# Patient Record
Sex: Female | Born: 1965 | Race: White | Hispanic: No | Marital: Married | Smoking: Never smoker
Health system: Southern US, Community
[De-identification: ages and names within clinical notes are randomized; demographics above are authoritative.]

## PROBLEM LIST (undated history)

## (undated) ENCOUNTER — Emergency Department: Payer: Self-pay

## (undated) DIAGNOSIS — F32A Depression, unspecified: Secondary | ICD-10-CM

## (undated) DIAGNOSIS — F329 Major depressive disorder, single episode, unspecified: Secondary | ICD-10-CM

## (undated) DIAGNOSIS — J45909 Unspecified asthma, uncomplicated: Secondary | ICD-10-CM

## (undated) DIAGNOSIS — F419 Anxiety disorder, unspecified: Secondary | ICD-10-CM

## (undated) HISTORY — PX: ABDOMINAL HYSTERECTOMY: SHX81

## (undated) HISTORY — DX: Unspecified asthma, uncomplicated: J45.909

## (undated) HISTORY — DX: Depression, unspecified: F32.A

## (undated) HISTORY — DX: Major depressive disorder, single episode, unspecified: F32.9

## (undated) HISTORY — DX: Anxiety disorder, unspecified: F41.9

---

## 2002-04-23 HISTORY — PX: TUBAL LIGATION: SHX77

## 2004-10-21 HISTORY — PX: OTHER SURGICAL HISTORY: SHX169

## 2006-11-28 ENCOUNTER — Other Ambulatory Visit: Admission: RE | Admit: 2006-11-28 | Discharge: 2006-11-28 | Payer: Self-pay | Admitting: Family Medicine

## 2006-11-28 ENCOUNTER — Encounter: Payer: Self-pay | Admitting: Family Medicine

## 2006-12-19 ENCOUNTER — Encounter: Admission: RE | Admit: 2006-12-19 | Discharge: 2006-12-19 | Payer: Self-pay | Admitting: Family Medicine

## 2008-02-19 ENCOUNTER — Ambulatory Visit (HOSPITAL_COMMUNITY): Payer: Self-pay | Admitting: Psychiatry

## 2008-03-04 ENCOUNTER — Ambulatory Visit (HOSPITAL_COMMUNITY): Payer: Self-pay | Admitting: Psychiatry

## 2008-03-25 ENCOUNTER — Ambulatory Visit: Payer: Self-pay | Admitting: Family Medicine

## 2008-03-25 ENCOUNTER — Ambulatory Visit (HOSPITAL_COMMUNITY): Payer: Self-pay | Admitting: Psychiatry

## 2008-03-25 DIAGNOSIS — J45909 Unspecified asthma, uncomplicated: Secondary | ICD-10-CM | POA: Insufficient documentation

## 2008-03-25 DIAGNOSIS — F411 Generalized anxiety disorder: Secondary | ICD-10-CM | POA: Insufficient documentation

## 2008-03-29 LAB — CONVERTED CEMR LAB
ALT: 19 units/L (ref 0–35)
Alkaline Phosphatase: 85 units/L (ref 39–117)
Cholesterol, target level: 200 mg/dL
Creatinine, Ser: 0.83 mg/dL (ref 0.40–1.20)
HDL goal, serum: 40 mg/dL
LDL Goal: 160 mg/dL
TSH: 1.224 microintl units/mL (ref 0.350–4.50)
Total Bilirubin: 0.6 mg/dL (ref 0.3–1.2)
Total Protein: 7.2 g/dL (ref 6.0–8.3)

## 2008-05-17 ENCOUNTER — Ambulatory Visit (HOSPITAL_COMMUNITY): Payer: Self-pay | Admitting: Psychiatry

## 2008-08-09 ENCOUNTER — Ambulatory Visit (HOSPITAL_COMMUNITY): Payer: Self-pay | Admitting: Psychiatry

## 2008-10-12 ENCOUNTER — Ambulatory Visit (HOSPITAL_COMMUNITY): Payer: Self-pay | Admitting: Psychiatry

## 2008-11-11 ENCOUNTER — Ambulatory Visit (HOSPITAL_COMMUNITY): Payer: Self-pay | Admitting: Psychiatry

## 2008-11-25 ENCOUNTER — Ambulatory Visit (HOSPITAL_COMMUNITY): Payer: Self-pay | Admitting: Psychiatry

## 2009-01-10 ENCOUNTER — Ambulatory Visit (HOSPITAL_COMMUNITY): Payer: Self-pay | Admitting: Psychiatry

## 2009-01-13 ENCOUNTER — Ambulatory Visit (HOSPITAL_COMMUNITY): Payer: Self-pay | Admitting: Psychiatry

## 2009-03-28 ENCOUNTER — Ambulatory Visit: Payer: Self-pay | Admitting: Family Medicine

## 2009-03-28 ENCOUNTER — Encounter: Admission: RE | Admit: 2009-03-28 | Discharge: 2009-03-28 | Payer: Self-pay | Admitting: Family Medicine

## 2009-03-28 ENCOUNTER — Other Ambulatory Visit: Admission: RE | Admit: 2009-03-28 | Discharge: 2009-03-28 | Payer: Self-pay | Admitting: Family Medicine

## 2009-03-28 DIAGNOSIS — N920 Excessive and frequent menstruation with regular cycle: Secondary | ICD-10-CM | POA: Insufficient documentation

## 2009-03-29 LAB — CONVERTED CEMR LAB
ALT: 13 units/L (ref 0–35)
BUN: 13 mg/dL (ref 6–23)
Basophils Absolute: 0 10*3/uL (ref 0.0–0.1)
Chloride: 105 meq/L (ref 96–112)
Cholesterol: 160 mg/dL (ref 0–200)
Creatinine, Ser: 0.81 mg/dL (ref 0.40–1.20)
Eosinophils Absolute: 0.2 10*3/uL (ref 0.0–0.7)
Glucose, Bld: 103 mg/dL — ABNORMAL HIGH (ref 70–99)
HCT: 41.3 % (ref 36.0–46.0)
Hemoglobin: 14 g/dL (ref 12.0–15.0)
Lymphocytes Relative: 34 % (ref 12–46)
Lymphs Abs: 2.2 10*3/uL (ref 0.7–4.0)
Monocytes Absolute: 0.4 10*3/uL (ref 0.1–1.0)
Monocytes Relative: 7 % (ref 3–12)
Neutro Abs: 3.7 10*3/uL (ref 1.7–7.7)
Platelets: 183 10*3/uL (ref 150–400)
TSH: 1.62 microintl units/mL (ref 0.350–4.500)
VLDL: 15 mg/dL (ref 0–40)

## 2009-03-31 ENCOUNTER — Encounter: Payer: Self-pay | Admitting: Family Medicine

## 2009-03-31 ENCOUNTER — Telehealth: Payer: Self-pay | Admitting: Family Medicine

## 2009-03-31 LAB — CONVERTED CEMR LAB: Pap Smear: NORMAL

## 2009-04-12 ENCOUNTER — Ambulatory Visit (HOSPITAL_COMMUNITY): Payer: Self-pay | Admitting: Psychiatry

## 2009-07-05 ENCOUNTER — Ambulatory Visit (HOSPITAL_COMMUNITY): Payer: Self-pay | Admitting: Psychiatry

## 2009-08-16 ENCOUNTER — Ambulatory Visit (HOSPITAL_COMMUNITY): Payer: Self-pay | Admitting: Psychiatry

## 2009-11-08 ENCOUNTER — Ambulatory Visit (HOSPITAL_COMMUNITY): Payer: Self-pay | Admitting: Psychiatry

## 2010-02-08 ENCOUNTER — Ambulatory Visit (HOSPITAL_COMMUNITY): Payer: Self-pay | Admitting: Psychiatry

## 2010-02-24 ENCOUNTER — Telehealth: Payer: Self-pay | Admitting: Family Medicine

## 2010-03-29 ENCOUNTER — Encounter: Payer: Self-pay | Admitting: Family Medicine

## 2010-03-30 ENCOUNTER — Ambulatory Visit: Payer: Self-pay | Admitting: Family Medicine

## 2010-04-03 ENCOUNTER — Other Ambulatory Visit
Admission: RE | Admit: 2010-04-03 | Discharge: 2010-04-03 | Payer: Self-pay | Source: Home / Self Care | Admitting: Family Medicine

## 2010-04-03 LAB — CONVERTED CEMR LAB: Pap Smear: NORMAL

## 2010-04-04 ENCOUNTER — Encounter
Admission: RE | Admit: 2010-04-04 | Discharge: 2010-04-04 | Payer: Self-pay | Source: Home / Self Care | Attending: Family Medicine | Admitting: Family Medicine

## 2010-04-18 LAB — CONVERTED CEMR LAB
Pap Smear: NEGATIVE
Pap Smear: NORMAL

## 2010-05-03 ENCOUNTER — Ambulatory Visit (HOSPITAL_COMMUNITY)
Admission: RE | Admit: 2010-05-03 | Discharge: 2010-05-03 | Payer: Self-pay | Source: Home / Self Care | Attending: Psychiatry | Admitting: Psychiatry

## 2010-05-14 ENCOUNTER — Encounter: Payer: Self-pay | Admitting: Family Medicine

## 2010-05-25 NOTE — Assessment & Plan Note (Signed)
Summary: CPE w/ Pap   Vital Signs:  Patient profile:   45 year old female Height:      62 inches Weight:      140 pounds BMI:     25.70 Pulse rate:   63 / minute BP sitting:   116 / 70  (right arm) Cuff size:   regular  Vitals Entered By: Avon Gully CMA, Duncan Dull) (April 03, 2010 3:00 PM) CC: CPE and pap   Primary Care Provider:  Nani Gasser MD  CC:  CPE and pap.  History of Present Illness: Her for CPE and pap. Needs form completed for [facility] She had labs done for work and wants to review those here.   Current Medications (verified): 1)  Lamictal 100 Mg Tabs (Lamotrigine) .... One Tablet By Mouth Once Daily 2)  Proventil Hfa 108 (90 Base) Mcg/act Aers (Albuterol Sulfate) .... 2- 4 Puffs Inhaled Every 4-6 Hours As Needed Sob, Wheeze 3)  Celexa 20 Mg Tabs (Citalopram Hydrobromide) .... Take One Tablet By Mouth Once A Day  Allergies (verified): No Known Drug Allergies  Comments:  Nurse/Medical Assistant: The patient's medications and allergies were reviewed with the patient and were updated in the Medication and Allergy Lists. Avon Gully CMA, Duncan Dull) (April 03, 2010 3:01 PM)  Past History:  Past Medical History: Last updated: 03/25/2008 GAD with depression - See Dr. Norva Karvonen  Past Surgical History: Last updated: 03/25/2008 Tubal ligation 09/17/02 Tummy tuck July 2006.   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 Married to [place] with 4 kids.  ONe son lives at home. One Son died in a car accident 09/16/2009. Has 2 daughter.  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 Married to [place] with 4 kids.  ONe son lives at home. One Son died in a car accident 2009-09-16. Has 2 daughter.  Never Smoked Alcohol  use-no Drug use-no Regular exercise-no  Review of Systems       The patient complains of vision loss.  The patient denies anorexia, fever, weight loss, weight gain, decreased hearing, hoarseness, chest pain, syncope, dyspnea on exertion, peripheral edema, prolonged cough, headaches, hemoptysis, abdominal pain, melena, hematochezia, severe indigestion/heartburn, hematuria, incontinence, genital sores, muscle weakness, suspicious skin lesions, transient blindness, difficulty walking, depression, unusual weight change, abnormal bleeding, enlarged lymph nodes, and breast masses.    Physical Exam  General:  Well-developed,well-nourished,in no acute distress; alert,appropriate and cooperative throughout examination Head:  Normocephalic and atraumatic without obvious abnormalities. No apparent alopecia or balding. Eyes:  No corneal or conjunctival inflammation noted. EOMI. Perrla. Funduscopic exam benign, without hemorrhages, exudates or papilledema. Vision grossly normal. Ears:  External ear exam shows no significant lesions or deformities.  Otoscopic examination reveals clear canals, tympanic membranes are intact bilaterally without bulging, retraction, inflammation or discharge. Hearing is grossly normal bilaterally. Nose:  External nasal examination shows no deformity or inflammation.  Mouth:  Oral mucosa and oropharynx without lesions or exudates.  Teeth in good repair. Neck:  No deformities, masses, or tenderness noted. Chest Wall:  No deformities, masses, or tenderness noted. Breasts:  No mass, nodules, thickening, tenderness, bulging, retraction, inflamation, nipple discharge or skin changes noted.   Lungs:  Normal respiratory effort, chest expands symmetrically. Lungs are clear to auscultation, no crackles or wheezes. Heart:  Normal rate and regular rhythm. S1  and S2 normal without gallop, murmur, click, rub or other extra sounds. Abdomen:  Bowel sounds positive,abdomen soft and non-tender  without masses, organomegaly or hernias noted. Genitalia:  Normal introitus for age, no external lesions, no vaginal discharge, mucosa pink and moist, no vaginal or cervical lesions, no vaginal atrophy, no friaility or hemorrhage, normal uterus size and position, no adnexal masses or tenderness. She does have a thick white discharge.  Msk:  No deformity or scoliosis noted of thoracic or lumbar spine.   Pulses:  R and L carotid,radial,dorsalis pedis and posterior tibial pulses are full and equal bilaterally Extremities:  No clubbing, cyanosis, edema, or deformity noted with normal full range of motion of all joints.   Neurologic:  No cranial nerve deficits noted. Station and gait are normal. DTRs are symmetrical throughout. Sensory, motor and coordinative functions appear intact. Skin:  no rashes.  Lesion in the mid-abdomen a couple of inches below her umbilicus, firm and hyperpigmented.  Cervical Nodes:  No lymphadenopathy noted Axillary Nodes:  No palpable lymphadenopathy Psych:  Cognition and judgment appear intact. Alert and cooperative with normal attention span and concentration. No apparent delusions, illusions, hallucinations   Impression & Recommendations:  Problem # 1:  ROUTINE GYNECOLOGICAL EXAMINATION (ICD-V72.31) She has her mammogram and eye appt schedule for next week Encourage regular exercise, she is not getting any right now.  If pap is normal repeat in 2-3 years.   Problem # 2:  CANDIDIASIS, VAGINAL (ICD-112.1) Will tx and screen for vaginitis.  Her updated medication list for this problem includes:    Fluconazole 150 Mg Tabs (Fluconazole) .Marland Kitchen... Take 1 tablet by mouth once a day x 1  Complete Medication List: 1)  Lamictal 100 Mg Tabs (Lamotrigine) .... One tablet by mouth once daily 2)  Proventil Hfa 108 (90 Base) Mcg/act Aers (Albuterol sulfate) .... 2- 4 puffs inhaled every 4-6 hours as needed sob, wheeze 3)  Celexa 20 Mg Tabs (Citalopram hydrobromide) .... Take one  tablet by mouth once a day 4)  Fluconazole 150 Mg Tabs (Fluconazole) .... Take 1 tablet by mouth once a day x 1 5)  Kariva 0.15-0.02/0.01 Mg (21/5) Tabs (Desogestrel-ethinyl estradiol) .... Take 1 tablet by mouth once a day  Patient Instructions: 1)  It is important that you exercise reguarly at least 20 minutes 5 times a week. If you develop chest pain, have severe difficulty breathing, or feel very tired, stop exercising immediately and seek medical attention.  2)  Take calcium +vitamin D daily.  Prescriptions: KARIVA 0.15-0.02/0.01 MG (21/5) TABS (DESOGESTREL-ETHINYL ESTRADIOL) Take 1 tablet by mouth once a day  #30 x 1   Entered and Authorized by:   Nani Gasser MD   Signed by:   Nani Gasser MD on 04/03/2010   Method used:   Electronically to        [facility] [phone]* (retail)       [address]       [place], [place]  [address]       Ph: [phone] or [phone]       Fax: [phone]   RxID:   [id number] FLUCONAZOLE 150 MG TABS (FLUCONAZOLE) Take 1 tablet by mouth once a day x 1  #1 x 1   Entered and Authorized by:   Nani Gasser MD   Signed by:   Nani Gasser MD on 04/03/2010   Method used:   [facility]        [facility] [address] [phone]* (  retail)       [address]       [place], [place]  [address]       Ph: [phone] or [phone]       Fax: [phone]   RxID:   [id number]    Orders Added: 1)  Est. Patient age 88-64 [99396]   Immunization History:  Influenza Immunization History:    Influenza:  historical (12/22/2009)   Immunization History:  Influenza Immunization History:    Influenza:  Historical (12/22/2009)     Immunization History:  Influenza Immunization History:    Influenza:  historical (12/22/2009)

## 2010-05-25 NOTE — Progress Notes (Signed)
Summary: wants lab orders  Phone Note Call from Patient   Caller: Patient Summary of Call: Pt. called and scheduled a CPE w/ pap and wants her lab orders before her appt so she can discuss them while she is here for her CPE.. Call her when her lab orders are ready.Michaelle Copas  February 24, 2010 9:02 AM  Initial call taken by: Michaelle Copas,  February 24, 2010 9:02 AM  Follow-up for Phone Call        Dr. B will have to do this if she wants to go to the lab before month. Follow-up by: Nani Gasser MD,  February 24, 2010 9:27 AM  Additional Follow-up for Phone Call Additional follow up Details #1::        Pt appt isn't scheduled until december Additional Follow-up by: Kathlene November LPN,  February 24, 2010 11:00 AM

## 2010-06-13 ENCOUNTER — Telehealth: Payer: Self-pay | Admitting: Family Medicine

## 2010-07-11 NOTE — Progress Notes (Signed)
Summary: [facility]-refill OCP  Phone Note Call from Patient Call back at Home Phone [phone]   Caller: Patient Reason for Call: Refill Medication Summary of Call: pt states she was given Garnette Scheuermann to help with menses at last visit.  She states these ahve helped her issues and would like a refill.  Is it OK to refill?  Does pt need to come in for BP check?  Please advise and I will call to pharm. Initial call taken by: Francee Piccolo CMA Duncan Dull),  June 13, 2010 4:35 PM  Follow-up for Phone Call        Go ahead and refill for one month and then can come in for nurse BP check anytime.  Follow-up by: Nani Gasser MD,  June 13, 2010 4:55 PM  Additional Follow-up for Phone Call Additional follow up Details #1::        message left notifying pt that rx had been sent, but to please call me back. Additional Follow-up by: Francee Piccolo CMA Duncan Dull),  June 15, 2010 4:50 PM    Prescriptions: KARIVA 0.15-0.02/0.01 MG (21/5) TABS (DESOGESTREL-ETHINYL ESTRADIOL) Take 1 tablet by mouth once a day  #30 x 0   Entered and Authorized by:   Nani Gasser MD   Signed by:   Nani Gasser MD on 06/13/2010   Method used:   [facility]ronically to        [facility] [phone][facility] (retail)       [address]       [place], [place]  [address]       Ph: [phone] or [phone]       Fax: [phone]   RxID:   [id number]

## 2010-07-12 ENCOUNTER — Other Ambulatory Visit: Payer: Self-pay | Admitting: Family Medicine

## 2010-07-12 MED ORDER — DESOGESTREL-ETHINYL ESTRADIOL 0.15-0.02/0.01 MG (21/5) PO TABS: 1.0000 | ORAL_TABLET | Freq: Every day | ORAL | Status: DC

## 2010-07-12 NOTE — Telephone Encounter (Signed)
Pt called and requested refill on BP med. We will send but also advised pt needs a bp check. Pot voiced understanding

## 2010-08-03 ENCOUNTER — Encounter (INDEPENDENT_AMBULATORY_CARE_PROVIDER_SITE_OTHER): Payer: 59 | Admitting: Psychiatry

## 2010-08-03 DIAGNOSIS — F411 Generalized anxiety disorder: Secondary | ICD-10-CM

## 2010-08-03 DIAGNOSIS — F339 Major depressive disorder, recurrent, unspecified: Secondary | ICD-10-CM

## 2010-08-10 ENCOUNTER — Other Ambulatory Visit: Payer: Self-pay | Admitting: *Deleted

## 2010-08-10 MED ORDER — DESOGESTREL-ETHINYL ESTRADIOL 0.15-0.02/0.01 MG (21/5) PO TABS: 1.0000 | ORAL_TABLET | Freq: Every day | ORAL | Status: DC

## 2010-08-14 ENCOUNTER — Telehealth: Payer: Self-pay | Admitting: *Deleted

## 2010-08-14 MED ORDER — DESOGESTREL-ETHINYL ESTRADIOL 0.15-0.02/0.01 MG (21/5) PO TABS: 1.0000 | ORAL_TABLET | Freq: Every day | ORAL | Status: DC

## 2010-08-14 NOTE — Telephone Encounter (Signed)
Will call in refill.  Can restart next pill pack with her next period and can start the day she fills it but may have spotting during the first cycle. SHe def needs to keep BP check appt.

## 2010-08-14 NOTE — Telephone Encounter (Signed)
Pt is requesting refill on Cynthia Barrett that she is using to regulate menses.  She has not come in for her BP check.  Pt is scheduled for 08/15/10 for nurse visit to check BP.  While speaking with pt she states that she is has not had any pills for 8 days and is now having spotting like she will be starting her menses again.  Advised her that I will not be refilling today and I would get re-start instructions for her.  Please advise.

## 2010-08-17 ENCOUNTER — Ambulatory Visit (INDEPENDENT_AMBULATORY_CARE_PROVIDER_SITE_OTHER): Payer: 59 | Admitting: Family Medicine

## 2010-08-17 DIAGNOSIS — Z309 Encounter for contraceptive management, unspecified: Secondary | ICD-10-CM

## 2010-08-17 NOTE — Telephone Encounter (Signed)
Return call from pt.  She missed appt for BP check.  She will come in today at 3:30pm for BP check.  Advised pt that med has been called to pharm and given instructions for restarting meds.

## 2010-08-17 NOTE — Progress Notes (Signed)
  Subjective:    Patient ID: Cynthia Barrett, female    DOB: 1966/01/08, 45 y.o.   MRN: [id number]  HPI    Review of Systems     Objective:   Physical Exam        Assessment & Plan:  BP  - Looks great! Can continue birth contol.

## 2010-08-17 NOTE — Progress Notes (Signed)
  Subjective:    Patient ID: Cynthia Barrett, female    DOB: 03-28-1966, 45 y.o.   MRN: [id number]  HPI  Pt denies chest pain, SOB, dizziness, or heart palpitations.  Taking meds as directed w/o problems.  Denies medication side effects.  5 min spent with pt.   Review of Systems     Objective:   Physical Exam        Assessment & Plan:

## 2010-08-17 NOTE — Telephone Encounter (Signed)
I have attempted to contact this patient by phone with the following results: left message to return my call on answering machine to discuss med plan and BP check appt.

## 2010-10-02 ENCOUNTER — Encounter (INDEPENDENT_AMBULATORY_CARE_PROVIDER_SITE_OTHER): Payer: 59 | Admitting: Psychiatry

## 2010-10-02 DIAGNOSIS — F411 Generalized anxiety disorder: Secondary | ICD-10-CM

## 2010-10-02 DIAGNOSIS — F319 Bipolar disorder, unspecified: Secondary | ICD-10-CM

## 2010-12-04 ENCOUNTER — Encounter (INDEPENDENT_AMBULATORY_CARE_PROVIDER_SITE_OTHER): Payer: 59 | Admitting: Psychiatry

## 2010-12-04 DIAGNOSIS — F319 Bipolar disorder, unspecified: Secondary | ICD-10-CM

## 2010-12-21 ENCOUNTER — Ambulatory Visit
Admission: RE | Admit: 2010-12-21 | Discharge: 2010-12-21 | Disposition: A | Discharge status: Home / Self Care | Payer: 59 | Source: Ambulatory Visit | Attending: Family Medicine | Admitting: Family Medicine

## 2010-12-21 ENCOUNTER — Encounter: Payer: Self-pay | Admitting: Family Medicine

## 2010-12-21 ENCOUNTER — Ambulatory Visit (INDEPENDENT_AMBULATORY_CARE_PROVIDER_SITE_OTHER): Payer: 59 | Admitting: Family Medicine

## 2010-12-21 ENCOUNTER — Telehealth: Payer: Self-pay | Admitting: Family Medicine

## 2010-12-21 VITALS — BP 125/78 | HR 79 | Temp 98.6°F | Ht 61.0 in | Wt 145.0 lb

## 2010-12-21 DIAGNOSIS — R05 Cough: Secondary | ICD-10-CM

## 2010-12-21 DIAGNOSIS — R059 Cough, unspecified: Secondary | ICD-10-CM

## 2010-12-21 DIAGNOSIS — R053 Chronic cough: Secondary | ICD-10-CM

## 2010-12-21 NOTE — Telephone Encounter (Signed)
Call pt: CXR is nl. Start  claritin or zyrtec at bedtime and see if sxs improve over the next week.

## 2010-12-21 NOTE — Patient Instructions (Signed)
We will call you with the x-ray results. 

## 2010-12-21 NOTE — Progress Notes (Signed)
  Subjective:    Patient ID: Cynthia Barrett, female    DOB: 1965/06/15, 45 y.o.   MRN: [id number]  HPI  Cough for 3 months. Really thick mucous and will take an OTC expectorant.  Says will then go away in for about 2-3 weeks then will restart for about a week. No wheezing. No fever. Still able to exercise. No rhinorrhea. 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 No known no mold and mildew exposure.  Didn't try an allergy medicine. No heartburn or reflux sxs.  Cough can occ wake her up at night.  Hx of asthma.   Review of Systems     Objective:   Physical Exam  Constitutional: She is oriented to person, place, and time. She appears well-developed and well-nourished.  HENT:  Head: Normocephalic and atraumatic.  Right Ear: External ear normal.  Left Ear: External ear normal.  Nose: Nose normal.  Mouth/Throat: Oropharynx is clear and moist.       TMs and canals are clear.   Eyes: Conjunctivae and EOM are normal. Pupils are equal, round, and reactive to light.  Neck: Neck supple. No thyromegaly present.  Cardiovascular: Normal rate, regular rhythm and normal heart sounds.   Pulmonary/Chest: Effort normal and breath sounds normal. She has no wheezes.  Lymphadenopathy:    She has no cervical adenopathy.  Neurological: She is alert and oriented to person, place, and time.  Skin: Skin is warm and dry.  Psychiatric: She has a normal mood and affect.          Assessment & Plan:  Chronic cough-Will get CXR today. Peak Flow today green zone which is reassuring that this is not asthma.  Consider GERD vs allergies vs infection.  If CXR nl then will treat for allergies for one month ans see if improves her sxs. Will call with the results.

## 2010-12-22 NOTE — Telephone Encounter (Signed)
Pt.notified

## 2010-12-26 ENCOUNTER — Encounter: Payer: Self-pay | Admitting: Family Medicine

## 2011-01-04 ENCOUNTER — Telehealth: Payer: Self-pay | Admitting: Family Medicine

## 2011-01-04 NOTE — Telephone Encounter (Signed)
Can you call the pharm and let me know what generic OCP they have in stock.

## 2011-01-04 NOTE — Telephone Encounter (Signed)
Pharmacy called because we had prescribed azurette for the pt but they do not have in stock in brand or generic and want to know what we would like to change med to . Plan:  Routed the encounter to the provider. Jarvis Newcomer, LPN Domingo Dimes

## 2011-01-05 ENCOUNTER — Other Ambulatory Visit: Payer: Self-pay | Admitting: Family Medicine

## 2011-01-05 MED ORDER — DESOGESTREL-ETHINYL ESTRADIOL 0.15-0.02/0.01 MG (21/5) PO TABS: 1.0000 | ORAL_TABLET | Freq: Every day | ORAL | Status: DC

## 2011-01-05 NOTE — Telephone Encounter (Signed)
Completed.

## 2011-01-05 NOTE — Telephone Encounter (Signed)
Dr. Linford Arnold, I called the pharm and spoke with Florentina Addison and she said she doesn't know why the pharm called our office yesterday because they had viorele .015/.05/.01 mg in stock which is the exact same pill.  I filled this for the pt already, and you don't have to worry about it. Jarvis Newcomer, LPN Domingo Dimes

## 2011-01-24 ENCOUNTER — Encounter (INDEPENDENT_AMBULATORY_CARE_PROVIDER_SITE_OTHER): Payer: 59 | Admitting: Psychiatry

## 2011-01-24 DIAGNOSIS — F411 Generalized anxiety disorder: Secondary | ICD-10-CM

## 2011-01-24 DIAGNOSIS — F319 Bipolar disorder, unspecified: Secondary | ICD-10-CM

## 2011-03-06 ENCOUNTER — Other Ambulatory Visit (HOSPITAL_COMMUNITY): Payer: Self-pay | Admitting: Psychiatry

## 2011-03-06 DIAGNOSIS — F411 Generalized anxiety disorder: Secondary | ICD-10-CM

## 2011-03-06 DIAGNOSIS — F319 Bipolar disorder, unspecified: Secondary | ICD-10-CM

## 2011-03-09 ENCOUNTER — Ambulatory Visit: Payer: 59 | Admitting: Family Medicine

## 2011-03-23 ENCOUNTER — Encounter: Payer: Self-pay | Admitting: Family Medicine

## 2011-03-27 ENCOUNTER — Encounter: Payer: Self-pay | Admitting: Family Medicine

## 2011-03-27 ENCOUNTER — Ambulatory Visit (INDEPENDENT_AMBULATORY_CARE_PROVIDER_SITE_OTHER): Payer: Managed Care, Other (non HMO) | Admitting: Family Medicine

## 2011-03-27 DIAGNOSIS — R41840 Attention and concentration deficit: Secondary | ICD-10-CM

## 2011-03-27 DIAGNOSIS — Z789 Other specified health status: Secondary | ICD-10-CM

## 2011-03-27 DIAGNOSIS — R6882 Decreased libido: Secondary | ICD-10-CM

## 2011-03-27 MED ORDER — NORETHINDRONE ACET-ETHINYL EST 1-20 MG-MCG PO TABS: 1.0000 | ORAL_TABLET | Freq: Every day | ORAL | Status: DC

## 2011-03-27 NOTE — Progress Notes (Signed)
  Subjective:    Patient ID: Cynthia Barrett, female    DOB: October 02, 1965, 45 y.o.   MRN: [id number]  HPI Has had dec libido for the last several months. Has been on citalopram for several years and didn well. Has been more irritable and dec concentration at work. She is still having normal periods since starting the birth control 6 mo ago.  Was on Lo-ogestral for years before and didn't have any libido problems. . She has no interest in sex but her husband does. She says their relationship is fair.    Review of Systems     Objective:   Physical Exam  Constitutional: She is oriented to person, place, and time. She appears well-developed and well-nourished.  Neurological: She is alert and oriented to person, place, and time.  Skin: Skin is warm and dry.  Psychiatric: She has a normal mood and affect. Her behavior is normal.          Assessment & Plan:  Dec libido - Likely mulitfactorial - could be from the citalopram or the new birth control or just her overall mood esp since not dec in focus and inc in irritability.  Dsicussed several different options including inc her citalopram to address her mood or change to wellbutrin which doesn't have SE or change her OCP back to Lo-ogestral. Also working on her relationship with her husband. seh opted to start with changing her birth control first. F/u in 6 weeks. 20 min spent face to face in counseling.

## 2011-03-29 ENCOUNTER — Ambulatory Visit: Payer: 59 | Admitting: Family Medicine

## 2011-04-23 ENCOUNTER — Encounter (HOSPITAL_COMMUNITY): Payer: Self-pay

## 2011-04-23 ENCOUNTER — Other Ambulatory Visit (HOSPITAL_COMMUNITY): Payer: Self-pay | Admitting: Psychiatry

## 2011-04-23 MED ORDER — LAMOTRIGINE 200 MG PO TABS: 200.0000 mg | ORAL_TABLET | Freq: Every day | ORAL | Status: DC

## 2011-04-26 ENCOUNTER — Telehealth: Payer: Self-pay | Admitting: *Deleted

## 2011-04-26 MED ORDER — NORGESTREL-ETHINYL ESTRADIOL 0.3-30 MG-MCG PO TABS: 1.0000 | ORAL_TABLET | Freq: Every day | ORAL | Status: DC

## 2011-04-26 NOTE — Telephone Encounter (Signed)
Pt called with concerns that on her birth control because she has been on a 28 day pack and the new one is 21. Pt wants to this is what you meant to do and what does she need to do on the 7 days she is not on the pill. Please advise.  MA, LPN

## 2011-04-26 NOTE — Telephone Encounter (Signed)
LM for pt with instructions and to call back if any questions.  MA, LPN

## 2011-04-26 NOTE — Telephone Encounter (Signed)
Just don't take a pill during the 7 days (these are usually placebo pills anyway) and i will send over new rx for next month.

## 2011-04-30 ENCOUNTER — Other Ambulatory Visit (HOSPITAL_COMMUNITY): Payer: Self-pay | Admitting: Psychiatry

## 2011-04-30 ENCOUNTER — Encounter (HOSPITAL_COMMUNITY): Payer: Self-pay | Admitting: Psychiatry

## 2011-04-30 ENCOUNTER — Ambulatory Visit (INDEPENDENT_AMBULATORY_CARE_PROVIDER_SITE_OTHER): Payer: 59 | Admitting: Psychiatry

## 2011-04-30 VITALS — BP 102/64 | Ht 63.5 in | Wt 146.0 lb

## 2011-04-30 DIAGNOSIS — F319 Bipolar disorder, unspecified: Secondary | ICD-10-CM

## 2011-04-30 DIAGNOSIS — F909 Attention-deficit hyperactivity disorder, unspecified type: Secondary | ICD-10-CM

## 2011-04-30 DIAGNOSIS — F411 Generalized anxiety disorder: Secondary | ICD-10-CM

## 2011-04-30 MED ORDER — AMPHETAMINE-DEXTROAMPHET ER 20 MG PO CP24: 20.0000 mg | ORAL_CAPSULE | ORAL | Status: DC
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
## 2011-05-08 ENCOUNTER — Ambulatory Visit: Payer: Managed Care, Other (non HMO) | Admitting: Family Medicine

## 2011-07-13 ENCOUNTER — Ambulatory Visit (INDEPENDENT_AMBULATORY_CARE_PROVIDER_SITE_OTHER): Payer: 59 | Admitting: Psychiatry

## 2011-07-13 ENCOUNTER — Encounter (HOSPITAL_COMMUNITY): Payer: Self-pay | Admitting: Psychiatry

## 2011-07-13 VITALS — BP 118/72 | Ht 63.5 in | Wt 144.0 lb

## 2011-07-13 DIAGNOSIS — F909 Attention-deficit hyperactivity disorder, unspecified type: Secondary | ICD-10-CM

## 2011-07-13 DIAGNOSIS — F411 Generalized anxiety disorder: Secondary | ICD-10-CM

## 2011-07-13 DIAGNOSIS — F900 Attention-deficit hyperactivity disorder, predominantly inattentive type: Secondary | ICD-10-CM | POA: Insufficient documentation

## 2011-07-13 MED ORDER — AMPHETAMINE-DEXTROAMPHET ER 20 MG PO CP24: 20.0000 mg | ORAL_CAPSULE | ORAL | Status: DC

## 2011-07-13 MED ORDER — ALPRAZOLAM 0.5 MG PO TABS: 0.5000 mg | ORAL_TABLET | Freq: Every day | ORAL | Status: AC | PRN
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
## 2011-08-02 ENCOUNTER — Ambulatory Visit (INDEPENDENT_AMBULATORY_CARE_PROVIDER_SITE_OTHER): Payer: 59 | Admitting: Psychology

## 2011-08-02 ENCOUNTER — Encounter (HOSPITAL_COMMUNITY): Payer: Self-pay | Admitting: Psychology

## 2011-08-02 DIAGNOSIS — F909 Attention-deficit hyperactivity disorder, unspecified type: Secondary | ICD-10-CM

## 2011-08-02 DIAGNOSIS — F900 Attention-deficit hyperactivity disorder, predominantly inattentive type: Secondary | ICD-10-CM

## 2011-08-02 DIAGNOSIS — F411 Generalized anxiety disorder: Secondary | ICD-10-CM
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
## 2011-08-15 ENCOUNTER — Ambulatory Visit (HOSPITAL_COMMUNITY): Payer: Self-pay | Admitting: Psychology

## 2011-09-06 ENCOUNTER — Encounter (HOSPITAL_COMMUNITY): Payer: Self-pay | Admitting: Psychology

## 2011-09-06 ENCOUNTER — Ambulatory Visit (INDEPENDENT_AMBULATORY_CARE_PROVIDER_SITE_OTHER): Payer: 59 | Admitting: Psychology

## 2011-09-06 DIAGNOSIS — F411 Generalized anxiety disorder: Secondary | ICD-10-CM

## 2011-09-06 DIAGNOSIS — F900 Attention-deficit hyperactivity disorder, predominantly inattentive type: Secondary | ICD-10-CM

## 2011-09-06 DIAGNOSIS — F909 Attention-deficit hyperactivity disorder, unspecified type: Secondary | ICD-10-CM

## 2011-09-06 NOTE — Patient Instructions (Signed)
1-I can only control my thoughts, feelings and behaviors.  I cannot control anyone else and what they are going to say or do. 2-Be honest with yourself about what is happening in your relationship. 3-Its okay to make time for myself emotionally.  The therapist expects that I will spend a minimum of six minutes three times per week taking care of my emotional needs.
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
## 2011-09-13 ENCOUNTER — Ambulatory Visit (INDEPENDENT_AMBULATORY_CARE_PROVIDER_SITE_OTHER): Payer: 59 | Admitting: Psychology

## 2011-09-13 ENCOUNTER — Encounter (HOSPITAL_COMMUNITY): Payer: Self-pay | Admitting: Psychology

## 2011-09-13 DIAGNOSIS — F900 Attention-deficit hyperactivity disorder, predominantly inattentive type: Secondary | ICD-10-CM

## 2011-09-13 DIAGNOSIS — F909 Attention-deficit hyperactivity disorder, unspecified type: Secondary | ICD-10-CM

## 2011-09-13 DIAGNOSIS — F411 Generalized anxiety disorder: Secondary | ICD-10-CM

## 2011-09-13 NOTE — Progress Notes (Signed)
   THERAPIST PROGRESS NOTE  Session Time: 1200- 100 am  Participation Level: Active  Behavioral Response: CasualAlertEuthymic  Type of Therapy: Individual Therapy  Treatment Goals addressed: Anxiety, Communication: in relationships and Coping  Interventions: Solution Focused, Strength-based, Psychosocial Skills: managing change, consistency in parenting, self-care and Supportive  Summary: Cynthia Barrett is a 46 y.o. female who presents as pleasant and easily engaged.  She reports she has done pretty well over the past week.  She has been taking care of herself despite her husband's discontent.  She reports he has been 'on his best behavior' since he read her personal messages on her phone and she told him she didn't want to be married.  She has been out Monday night visiting a neighbor for three hours, Tuesday night helping her daughter write a resume, and last night at [employer] class.  She knows her husband was not happy with her last night because he was in pain from kidney stones and wanted her to sit with him; she told him that he could call if needed and that she would be gone about an hour.  She suggested that if he was in an emergent situation she would have stayed to assist but this was not the case.  The patient reports the turning point for her in knowing that she needed to get out of her marriage was after the death of her 12 year old son.  She reports thinking about life and acknowledging that no one knows how long they have and she wants to enjoy her life and she was not.  She feels good about her parenting efforts over the past week and shared details of an intervention with her son's behavior that resulted in her son coming to apologize to her which has never happened.  I provided Ms. Wisby with a behavior plan template and suggested this tool could be helpful for consistency for her son; she admits that she is bad cop and that dad is good cop.  I explained that it would be helpful for  her to consider having this is place prior to her leaving her husband so that she can continue the plan when she leaves.  Suicidal/Homicidal: No  Plan: Return again in 1 week.  Diagnosis: Axis I: ADHD; GAD    Axis II: No diagnosis    [name], [facility] 09/13/2011

## 2011-10-01 ENCOUNTER — Ambulatory Visit (HOSPITAL_COMMUNITY): Payer: Self-pay | Admitting: Psychology

## 2011-10-10 ENCOUNTER — Ambulatory Visit (HOSPITAL_COMMUNITY): Payer: Self-pay | Admitting: Psychology

## 2011-10-15 ENCOUNTER — Ambulatory Visit (HOSPITAL_COMMUNITY): Payer: Self-pay | Admitting: Psychiatry

## 2011-10-18 ENCOUNTER — Ambulatory Visit (HOSPITAL_COMMUNITY): Payer: Self-pay | Admitting: Psychology

## 2011-10-19 ENCOUNTER — Ambulatory Visit (HOSPITAL_COMMUNITY): Payer: Self-pay | Admitting: Psychiatry

## 2011-10-29 ENCOUNTER — Ambulatory Visit (INDEPENDENT_AMBULATORY_CARE_PROVIDER_SITE_OTHER): Payer: 59 | Admitting: Psychiatry

## 2011-10-29 VITALS — BP 142/84 | Ht 63.5 in | Wt 144.0 lb

## 2011-10-29 DIAGNOSIS — F411 Generalized anxiety disorder: Secondary | ICD-10-CM

## 2011-10-29 DIAGNOSIS — F909 Attention-deficit hyperactivity disorder, unspecified type: Secondary | ICD-10-CM

## 2011-10-29 DIAGNOSIS — F319 Bipolar disorder, unspecified: Secondary | ICD-10-CM

## 2011-10-29 MED ORDER — AMPHETAMINE-DEXTROAMPHET ER 20 MG PO CP24: 20.0000 mg | ORAL_CAPSULE | ORAL | Status: DC

## 2011-10-30 ENCOUNTER — Encounter (HOSPITAL_COMMUNITY): Payer: Self-pay | Admitting: Psychiatry
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
## 2011-11-05 ENCOUNTER — Ambulatory Visit (HOSPITAL_COMMUNITY): Payer: Self-pay | Admitting: Psychology

## 2011-11-08 ENCOUNTER — Other Ambulatory Visit (HOSPITAL_COMMUNITY): Payer: Self-pay | Admitting: Psychiatry

## 2011-11-23 ENCOUNTER — Telehealth (HOSPITAL_COMMUNITY): Payer: Self-pay

## 2011-11-23 NOTE — Telephone Encounter (Signed)
Spoke with patient. She stated that she had a very bad day yesterday she was contemplating suicide by overdosing on her Xanax. She ended up driving all night and sleeping in her band. She is back home today. She is with her daughter. She does not really want to talk to anyone. She reports that the police were called and they went to her job. Patient was not there. Patient did not call in this morning for work. She may be in trouble for this. They note work what is going on now. She made may need to be written out for [facility]. I advised that she stay with her daughter the weekend. She does not have to talk to them she does not wish to. She will update me on Monday.

## 2011-11-23 NOTE — Telephone Encounter (Signed)
NEEDS TO SPEAK TO YOU ASAP. COULDN'T REALLY HEAR PHONE BREAKING UP

## 2011-12-12 ENCOUNTER — Telehealth (HOSPITAL_COMMUNITY): Payer: Self-pay

## 2011-12-12 ENCOUNTER — Other Ambulatory Visit (HOSPITAL_COMMUNITY): Payer: Self-pay | Admitting: Psychiatry

## 2011-12-12 MED ORDER — LAMOTRIGINE 200 MG PO TABS: 200.0000 mg | ORAL_TABLET | Freq: Every day | ORAL | Status: DC

## 2011-12-12 MED ORDER — ALPRAZOLAM 0.5 MG PO TABS: 0.5000 mg | ORAL_TABLET | Freq: Every evening | ORAL | Status: DC | PRN

## 2011-12-12 NOTE — Telephone Encounter (Signed)
OK 

## 2012-01-02 ENCOUNTER — Encounter (HOSPITAL_COMMUNITY): Payer: Self-pay | Admitting: Psychiatry

## 2012-01-02 ENCOUNTER — Ambulatory Visit (INDEPENDENT_AMBULATORY_CARE_PROVIDER_SITE_OTHER): Payer: 59 | Admitting: Psychiatry

## 2012-01-02 VITALS — BP 124/82 | Ht 63.5 in | Wt 138.0 lb

## 2012-01-02 DIAGNOSIS — F988 Other specified behavioral and emotional disorders with onset usually occurring in childhood and adolescence: Secondary | ICD-10-CM

## 2012-01-02 DIAGNOSIS — F411 Generalized anxiety disorder: Secondary | ICD-10-CM

## 2012-01-02 DIAGNOSIS — F319 Bipolar disorder, unspecified: Secondary | ICD-10-CM

## 2012-01-02 DIAGNOSIS — F909 Attention-deficit hyperactivity disorder, unspecified type: Secondary | ICD-10-CM

## 2012-01-02 MED ORDER — AMPHETAMINE-DEXTROAMPHET ER 20 MG PO CP24: 20.0000 mg | ORAL_CAPSULE | ORAL | Status: DC

## 2012-01-03 NOTE — Progress Notes (Signed)
   [facility] Behavioral Health Follow-up Outpatient Visit  Cynthia Barrett 12/23/1965   Subjective: The patient is a 46 year old female who has been followed by [facility] since October 2009. She is currently diagnosed with generalized anxiety disorder and bipolar disorder, along with ADHD. She had been doing well on Adderall XR, Xanax, Celexa, and Lamictal. The patient had called in crisis since last appointment. She was having suicidal thoughts. She ended up taking herself to the hospital. She was hospitalized at [facility] for a week. She had a difficult time there. While there, they did increase her Celexa to 40 mg daily. The patient has been back to work. The custody issues continue. She has been very anxious. She has been able to have her son on weekends, and recently the first 3 days of the week. Her husband, who she is separated from, is in school now Monday Tuesday Wednesday. She watches him on these evenings. She feels that the husband is holding this over her head. She has been discharged from therapy secondary to no-shows.  Filed Vitals:   01/02/12 1604  BP: 124/82    Mental Status Examination  Appearance: Casual Alert: Yes Attention: good  Cooperative: Yes Eye Contact: Good Speech: Regular rate rhythm and volume Psychomotor Activity: Normal Memory/Concentration: Intact Oriented: person, place, time/date and situation Mood: Euthymic Affect: Full Range Thought Processes and Associations: Logical Fund of Knowledge: Fair Thought Content: No suicidal homicidal thoughts Insight: Fair Judgement: Fair  Diagnosis: Generalized anxiety disorder, bipolar disorder, ADHD inattentive type  Treatment Plan: At this point we'll not make any changes. I will see the patient back in one month, and follow her monthly.  Jamse Mead, MD

## 2012-02-04 ENCOUNTER — Ambulatory Visit (HOSPITAL_COMMUNITY): Payer: 59 | Admitting: Psychiatry

## 2012-04-17 ENCOUNTER — Other Ambulatory Visit (HOSPITAL_COMMUNITY): Payer: Self-pay

## 2012-04-18 MED ORDER — LAMOTRIGINE 200 MG PO TABS: 200.0000 mg | ORAL_TABLET | Freq: Every day | ORAL | Status: DC

## 2012-04-18 NOTE — Telephone Encounter (Signed)
done

## 2012-04-22 ENCOUNTER — Encounter (HOSPITAL_COMMUNITY): Payer: Self-pay | Admitting: Psychology

## 2012-04-22 NOTE — Progress Notes (Signed)
Patient ID: Cynthia Barrett, female   DOB: June 05, 1965, 46 y.o.   MRN: [id number] Outpatient Therapist Discharge Summary  Cynthia Barrett    [birth year]   Admission Date: 08/02/2011 with Ms. [name]  Discharge Date:  11/05/2011 Reason for Discharge: failed appointments Diagnosis:  Axis I:  ADHD,GAD  Axis II:  None  Axis III:  Intrinsic Asthma, menorrhagia  Comments:  Has been in therapy on two prior occasions at [facility] location with Ms. [name] and with [name]. Spouse (estranged) is currently being served by Ms. [name] and was previously served by Ms. [name], and her son is currently being served by Mr. [name].  Recommend that therapy services be received at [facility] location or a provider outside [facility] be sought if future therapy be considered.  [name], MA LPC

## 2012-04-24 ENCOUNTER — Other Ambulatory Visit (HOSPITAL_COMMUNITY): Payer: Self-pay | Admitting: Psychiatry

## 2012-04-24 MED ORDER — LAMOTRIGINE 200 MG PO TABS: 200.0000 mg | ORAL_TABLET | Freq: Every day | ORAL | Status: DC

## 2012-04-27 ENCOUNTER — Other Ambulatory Visit: Payer: Self-pay | Admitting: Family Medicine

## 2012-04-28 ENCOUNTER — Other Ambulatory Visit (HOSPITAL_COMMUNITY): Payer: Self-pay | Admitting: Psychiatry

## 2012-05-15 ENCOUNTER — Ambulatory Visit (HOSPITAL_COMMUNITY): Payer: Self-pay | Admitting: Psychiatry

## 2012-05-21 ENCOUNTER — Telehealth: Payer: Self-pay | Admitting: Family Medicine

## 2012-05-21 NOTE — Telephone Encounter (Signed)
Left message on machine of information. Told to call us if she would like for [place] to schedule it for her

## 2012-05-21 NOTE — Telephone Encounter (Signed)
Call pt: Due for mammo.  Would she like up to schedule?

## 2012-06-06 ENCOUNTER — Ambulatory Visit (HOSPITAL_COMMUNITY): Payer: Self-pay | Admitting: Psychiatry

## 2012-07-08 ENCOUNTER — Encounter (HOSPITAL_COMMUNITY): Payer: Self-pay | Admitting: Psychiatry

## 2012-07-08 ENCOUNTER — Ambulatory Visit (INDEPENDENT_AMBULATORY_CARE_PROVIDER_SITE_OTHER): Payer: 59 | Admitting: Psychiatry

## 2012-07-08 VITALS — BP 122/84 | Ht 63.5 in | Wt 156.0 lb

## 2012-07-08 DIAGNOSIS — F411 Generalized anxiety disorder: Secondary | ICD-10-CM

## 2012-07-08 DIAGNOSIS — F909 Attention-deficit hyperactivity disorder, unspecified type: Secondary | ICD-10-CM

## 2012-07-08 DIAGNOSIS — F988 Other specified behavioral and emotional disorders with onset usually occurring in childhood and adolescence: Secondary | ICD-10-CM

## 2012-07-08 DIAGNOSIS — F319 Bipolar disorder, unspecified: Secondary | ICD-10-CM

## 2012-07-08 MED ORDER — LAMOTRIGINE 150 MG PO TABS: 150.0000 mg | ORAL_TABLET | Freq: Two times a day (BID) | ORAL | Status: DC

## 2012-07-08 NOTE — Progress Notes (Signed)
   [facility] Behavioral Health Follow-up Outpatient Visit  Cynthia Barrett May 10, 1965   Subjective: The patient is a 47 year old female who has been followed by [facility] since October 2009. She is currently diagnosed with generalized anxiety disorder and bipolar disorder, along with ADHD. She had been doing well on Adderall XR, Xanax, Celexa, and Lamictal. I have not seen her since September. I was to see the patient monthly, but she no showed her last appointment and then has rescheduled several since. She presents today. She is no longer taking the Adderall XR. She had been noncompliant with medication, but has been back on them for the past several weeks. She and her husband have reconciled. They got back together in November. There attending weekly counseling. She sees a big difference. She continues to work. Patient has noticed a cyclic formation to her mood. She is either sleeping too much, or not enough. The patient is not currently on his schedule. She felt the Adderall XR wired her and revved her up. She reports having a episode at work about 8 weeks ago where she broke down sobbing. She does not feel the Celexa is helping. She has been on 40 mg daily since hospitalization. She uses the Xanax sparingly.  Filed Vitals:   07/08/12 1544  BP: 122/84    Mental Status Examination  Appearance: Casual Alert: Yes Attention: good  Cooperative: Yes Eye Contact: Good Speech: Regular rate rhythm and volume Psychomotor Activity: Normal Memory/Concentration: Intact Oriented: person, place, time/date and situation Mood: Euthymic Affect: Full Range Thought Processes and Associations: Logical Fund of Knowledge: Fair Thought Content: No suicidal homicidal thoughts Insight: Fair Judgement: Fair  Diagnosis: Generalized anxiety disorder, bipolar disorder, ADHD inattentive type  Treatment Plan: I will increase Lamictal to 150 mg twice a day. I will discontinue the Adderall XR since  patient is not taking it. I will continue the Xanax 0.5 mg daily as needed. I will also continue the Celexa 40 mg daily, with a plan to change this at the next appointment. I will see the patient back in one month. Patient may call with concerns. Jamse Mead, MD

## 2012-08-19 ENCOUNTER — Ambulatory Visit (INDEPENDENT_AMBULATORY_CARE_PROVIDER_SITE_OTHER): Payer: 59 | Admitting: Psychiatry

## 2012-08-19 ENCOUNTER — Encounter (HOSPITAL_COMMUNITY): Payer: Self-pay | Admitting: Psychiatry

## 2012-08-19 VITALS — BP 110/72 | Ht 63.5 in | Wt 156.0 lb

## 2012-08-19 DIAGNOSIS — F411 Generalized anxiety disorder: Secondary | ICD-10-CM

## 2012-08-19 DIAGNOSIS — F909 Attention-deficit hyperactivity disorder, unspecified type: Secondary | ICD-10-CM

## 2012-08-19 DIAGNOSIS — F319 Bipolar disorder, unspecified: Secondary | ICD-10-CM

## 2012-08-19 DIAGNOSIS — F988 Other specified behavioral and emotional disorders with onset usually occurring in childhood and adolescence: Secondary | ICD-10-CM

## 2012-08-19 MED ORDER — CITALOPRAM HYDROBROMIDE 40 MG PO TABS: ORAL_TABLET | ORAL | Status: DC

## 2012-08-19 MED ORDER — LAMOTRIGINE 150 MG PO TABS: 150.0000 mg | ORAL_TABLET | Freq: Two times a day (BID) | ORAL | Status: DC

## 2012-08-19 NOTE — Progress Notes (Signed)
[facility]al Health Follow-up Outpatient Visit  Cynthia Barrett 29-Jun-1965   Subjective: The patient is a 47 year old female who has been followed by [facility] since October 2009. She is currently diagnosed with generalized anxiety disorder and bipolar disorder, along with ADHD. At her last appointment, I stopped her Adderall XR because she was not taking it. The patient was asking for a stronger mood stabilizer. I increased her Lamictal 150 mg twice a day. I continued her Celexa. She presents today. She is doing well. She and her husband are continuing marriage therapy. It appears to be helping. She's getting more respect for her son. She does express dissatisfaction with her job. She has 21 years until retirement. 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 The patient endorses good sleep and appetite. Mood has been stable. She's doing well off the Adderall XR. She has no concerns today.  Filed Vitals:   08/19/12 1602  BP: 110/72   Active Ambulatory Problems    Diagnosis Date Noted  . ANXIETY DISORDER, GENERALIZED 03/25/2008  . INTRINSIC ASTHMA, UNSPECIFIED 03/25/2008  . MENORRHAGIA 03/28/2009  . 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 No Resolved Ambulatory Problems   Past Medical History  Diagnosis Date  . Depression   . Anxiety    Current Outpatient Prescriptions on File Prior to Visit  Medication Sig Dispense Refill  . ALPRAZolam (XANAX) 0.5 MG tablet Take 1 tablet (0.5 mg total) by mouth at bedtime as needed.  90 tablet  1  . amphetamine-dextroamphetamine (ADDERALL XR) 20 MG 24 hr capsule Take 1 capsule (20 mg total) by mouth every morning. Fill after 05/30/11  30 capsule  0  . amphetamine-dextroamphetamine (ADDERALL XR) 20 MG 24 hr capsule Take 1 capsule (20 mg total) by mouth every morning.  30 capsule  0  . amphetamine-dextroamphetamine (ADDERALL XR)  20 MG 24 hr capsule Take 1 capsule (20 mg total) by mouth every morning.  90 capsule  0  . amphetamine-dextroamphetamine (ADDERALL XR) 20 MG 24 hr capsule Take 1 capsule (20 mg total) by mouth every morning.  30 capsule  0  . amphetamine-dextroamphetamine (ADDERALL XR) 20 MG 24 hr capsule Take 1 capsule (20 mg total) by mouth every morning. Fill after 11/28/11  30 capsule  0  . amphetamine-dextroamphetamine (ADDERALL XR) 20 MG 24 hr capsule Take 1 capsule (20 mg total) by mouth every morning.  30 capsule  0  . CRYSELLE-28 0.3-30 MG-MCG tablet TAKE 1 TABLET BY MOUTH DAILY.  28 tablet  6   No current facility-administered medications on file prior to visit.   Review of Systems - General ROS: negative for - sleep disturbance or weight loss Psychological ROS: negative for - anxiety or depression Cardiovascular ROS: no chest pain or dyspnea on exertion Musculoskeletal ROS: negative for - gait disturbance or muscular weakness Neurological ROS: negative for - headaches or seizures  Mental Status Examination  Appearance: Casual Alert: Yes Attention: good  Cooperative: Yes Eye Contact: Good Speech: Regular rate rhythm and volume Psychomotor Activity: Normal Memory/Concentration: Intact Oriented: person, place, time/date and situation Mood: Euthymic Affect: Full Range Thought Processes and Associations: Logical Fund of Knowledge: Fair Thought Content: No suicidal homicidal thoughts Insight: Fair Judgement: Fair  Diagnosis: Generalized anxiety disorder, bipolar disorder, ADHD inattentive type  Treatment Plan: I will not make any changes today. I will continue the Lamictal at 150 mg twice  a day, the Celexa at 40 mg daily, and the Xanax as needed. I will see the patient back in 3 months. Patient may call with concerns. Jamse Mead, MD

## 2012-09-01 ENCOUNTER — Other Ambulatory Visit (HOSPITAL_COMMUNITY): Payer: Self-pay | Admitting: Psychiatry

## 2012-10-03 ENCOUNTER — Other Ambulatory Visit (HOSPITAL_COMMUNITY): Payer: Self-pay | Admitting: Psychiatry

## 2012-10-03 MED ORDER — CITALOPRAM HYDROBROMIDE 10 MG PO TABS: ORAL_TABLET | ORAL | Status: DC

## 2012-11-18 ENCOUNTER — Encounter (HOSPITAL_COMMUNITY): Payer: Self-pay | Admitting: Psychiatry

## 2012-11-18 ENCOUNTER — Ambulatory Visit (INDEPENDENT_AMBULATORY_CARE_PROVIDER_SITE_OTHER): Payer: 59 | Admitting: Psychiatry

## 2012-11-18 VITALS — BP 118/78 | Ht 63.5 in | Wt 158.0 lb

## 2012-11-18 DIAGNOSIS — F411 Generalized anxiety disorder: Secondary | ICD-10-CM

## 2012-11-18 DIAGNOSIS — F909 Attention-deficit hyperactivity disorder, unspecified type: Secondary | ICD-10-CM

## 2012-11-18 DIAGNOSIS — F319 Bipolar disorder, unspecified: Secondary | ICD-10-CM

## 2012-11-18 DIAGNOSIS — F988 Other specified behavioral and emotional disorders with onset usually occurring in childhood and adolescence: Secondary | ICD-10-CM

## 2012-11-18 MED ORDER — ALPRAZOLAM 0.5 MG PO TABS: 0.5000 mg | ORAL_TABLET | Freq: Every evening | ORAL | Status: DC | PRN

## 2012-11-18 NOTE — Progress Notes (Signed)
[facility] Behavioral Health Follow-up Outpatient Visit  Cynthia Barrett [birth year]   Subjective: The patient is a 47 year old female who has been followed by [facility] since October 2009. She is currently diagnosed with generalized anxiety disorder and bipolar disorder, along with ADHD. At her last appointment, I did not make any changes.  Patient reports that everything is going very well. Her husband did have knee replacement surgery 2 weeks ago. He is recovering slowly. They have completed marriage counseling. There've openly communicating very well. Her young son is behaving. The patient knows that her anxiety is under control because she's been through a lot in the last month. Both her parents have been hospitalized in [place]. His been a lot to deal with. Her mom is actually right now in the ICU on a ventilator after having an infection stemming from her repaired ankle replacement. Her father had a collapsed lung and was DO NOT RESUSCITATE. The lung inflated on its own. The patient feels that she should be in [place], but also should be here taking care of her husband. She states there was a time that she wouldn't been able to make it, without falling apart.  Filed Vitals:   11/18/12 1611  BP: 118/78   Active Ambulatory Problems    Diagnosis Date Noted  . ANXIETY DISORDER, GENERALIZED 03/25/2008  . INTRINSIC ASTHMA, UNSPECIFIED 03/25/2008  . MENORRHAGIA 03/28/2009  . 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 No Resolved Ambulatory Problems   Past Medical History  Diagnosis Date  . Depression   . Anxiety    Current Outpatient Prescriptions on File Prior to Visit  Medication Sig Dispense Refill  . amphetamine-dextroamphetamine (ADDERALL XR) 20 MG 24 hr capsule Take 1 capsule (20 mg total) by mouth every morning. Fill after 05/30/11  30 capsule  0  . amphetamine-dextroamphetamine (ADDERALL XR) 20 MG 24 hr  capsule Take 1 capsule (20 mg total) by mouth every morning.  30 capsule  0  . amphetamine-dextroamphetamine (ADDERALL XR) 20 MG 24 hr capsule Take 1 capsule (20 mg total) by mouth every morning.  90 capsule  0  . amphetamine-dextroamphetamine (ADDERALL XR) 20 MG 24 hr capsule Take 1 capsule (20 mg total) by mouth every morning.  30 capsule  0  . amphetamine-dextroamphetamine (ADDERALL XR) 20 MG 24 hr capsule Take 1 capsule (20 mg total) by mouth every morning. Fill after 11/28/11  30 capsule  0  . amphetamine-dextroamphetamine (ADDERALL XR) 20 MG 24 hr capsule Take 1 capsule (20 mg total) by mouth every morning.  30 capsule  0  . citalopram (CELEXA) 10 MG tablet TAKE 1 TABLET DAILY  90 tablet  1  . CRYSELLE-28 0.3-30 MG-MCG tablet TAKE 1 TABLET BY MOUTH DAILY.  28 tablet  6  . lamoTRIgine (LAMICTAL) 150 MG tablet Take 1 tablet (150 mg total) by mouth 2 (two) times daily.  180 tablet  1  . lamoTRIgine (LAMICTAL) 150 MG tablet TAKE 1 TABLET (150 MG TOTAL) BY MOUTH 2 (TWO) TIMES DAILY.  60 tablet  2   No current facility-administered medications on file prior to visit.   Review of Systems - General ROS: negative for - sleep disturbance or weight loss Psychological ROS: negative for - anxiety or depression Cardiovascular ROS: no chest pain or dyspnea on exertion Musculoskeletal ROS: negative for - gait disturbance or muscular weakness Neurological ROS: negative for - headaches or seizures  Mental Status Examination  Appearance: Casual Alert: Yes Attention: good  Cooperative: Yes Eye Contact: Good Speech: Regular rate rhythm and volume Psychomotor Activity: Normal Memory/Concentration: Intact Oriented: person, place, time/date and situation Mood: Euthymic Affect: Full Range Thought Processes and Associations: Logical Fund of Knowledge: Fair Thought Content: No suicidal homicidal thoughts Insight: Fair Judgement: Fair  Diagnosis: Generalized anxiety disorder, bipolar disorder, ADHD  inattentive type  Treatment Plan: I will not make any changes today. I will continue the Lamictal at 150 mg twice a day, the Celexa at 40 mg daily, and the Xanax as needed. I will see the patient back in 3 months. Patient may call with concerns. Jamse Mead, MD

## 2012-11-28 ENCOUNTER — Other Ambulatory Visit: Payer: Self-pay | Admitting: Family Medicine

## 2012-12-01 ENCOUNTER — Other Ambulatory Visit: Payer: Self-pay | Admitting: Family Medicine

## 2013-01-22 ENCOUNTER — Other Ambulatory Visit: Payer: Self-pay | Admitting: Family Medicine

## 2013-02-04 ENCOUNTER — Telehealth: Payer: Self-pay | Admitting: Family Medicine

## 2013-02-04 NOTE — Telephone Encounter (Signed)
Cynthia Barrett with [facility] called. Patient came in for an abnormal routine screening; needs an Order for a rt diagnostic mammogram and possibly an Ultrasound

## 2013-02-04 NOTE — Telephone Encounter (Signed)
Fax # [phone].

## 2013-02-04 NOTE — Telephone Encounter (Signed)
Order faxed.Cynthia Barrett [name]  

## 2013-02-18 ENCOUNTER — Encounter (HOSPITAL_COMMUNITY): Payer: Self-pay | Admitting: Psychiatry

## 2013-02-18 ENCOUNTER — Encounter (INDEPENDENT_AMBULATORY_CARE_PROVIDER_SITE_OTHER): Payer: Self-pay

## 2013-02-18 ENCOUNTER — Ambulatory Visit (INDEPENDENT_AMBULATORY_CARE_PROVIDER_SITE_OTHER): Payer: 59 | Admitting: Psychiatry

## 2013-02-18 VITALS — BP 112/75 | Ht 63.5 in | Wt 160.0 lb

## 2013-02-18 DIAGNOSIS — F319 Bipolar disorder, unspecified: Secondary | ICD-10-CM

## 2013-02-18 DIAGNOSIS — F411 Generalized anxiety disorder: Secondary | ICD-10-CM

## 2013-02-18 DIAGNOSIS — F988 Other specified behavioral and emotional disorders with onset usually occurring in childhood and adolescence: Secondary | ICD-10-CM

## 2013-02-18 MED ORDER — LAMOTRIGINE 150 MG PO TABS: ORAL_TABLET | ORAL | Status: DC

## 2013-02-19 NOTE — Progress Notes (Signed)
   [facility] Behavioral Health Follow-up Outpatient Visit  RUMOR SUN 07/11/1965   Subjective: The patient is a 47 year old female who has been followed by [facility] since October 2009. She is currently diagnosed with generalized anxiety disorder and bipolar disorder, along with ADHD. At her last appointment, I did not make any changes.  She presents today. Both her parents are doing much better. Her father is actually in a nursing home and will probably have to stay there. Mom and dad have having an amputation of her foot. She is currently in the same nursing home his dad but will be discharged home in a month. The patient's husband has recovered from the surgery. He continues with his schooling. The patient continues to work. Her son is doing well in school but is extremely disrespectful and rebellious at home. She reports that she and her husband have different parenting styles, and it causes conflict. The patient reports she's been having some sleep issues. She will occasionally take Xanax to sleep, but then wakes 4 hours later. Appetite is good and she is up 2 pounds. Anxiety is under control. Mood is stable. Filed Vitals:   02/18/13 1610  BP: 112/75   Active Ambulatory Problems    Diagnosis Date Noted  . ANXIETY DISORDER, GENERALIZED 03/25/2008  . INTRINSIC ASTHMA, UNSPECIFIED 03/25/2008  . MENORRHAGIA 03/28/2009  . 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 No Resolved Ambulatory Problems   Past Medical History  Diagnosis Date  . Depression   . Anxiety    Current Outpatient Prescriptions on File Prior to Visit  Medication Sig Dispense Refill  . ALPRAZolam (XANAX) 0.5 MG tablet Take 1 tablet (0.5 mg total) by mouth at bedtime as needed.  90 tablet  1  . citalopram (CELEXA) 10 MG tablet TAKE 1 TABLET DAILY  90 tablet  1  . CRYSELLE-28 0.3-30 MG-MCG tablet TAKE 1 TABLET BY MOUTH DAILY.  28 tablet  6    No current facility-administered medications on file prior to visit.   Review of Systems - General ROS: negative for - sleep disturbance or weight loss Psychological ROS: negative for - anxiety or depression Cardiovascular ROS: no chest pain or dyspnea on exertion Musculoskeletal ROS: negative for - gait disturbance or muscular weakness Neurological ROS: negative for - headaches or seizures  Mental Status Examination  Appearance: Casual Alert: Yes Attention: good  Cooperative: Yes Eye Contact: Good Speech: Regular rate rhythm and volume Psychomotor Activity: Normal Memory/Concentration: Intact Oriented: person, place, time/date and situation Mood: Euthymic Affect: Full Range Thought Processes and Associations: Logical Fund of Knowledge: Fair Thought Content: No suicidal homicidal thoughts Insight: Fair Judgement: Fair  Diagnosis: Generalized anxiety disorder, bipolar disorder, ADHD inattentive type  Treatment Plan: I will not make any changes today. I will continue the Lamictal at 150 mg twice a day, the Celexa at 40 mg daily, and the Xanax as needed. The patient to return to clinic in 3 months. Patient may call with concerns. Jamse Mead, MD

## 2013-03-02 ENCOUNTER — Encounter: Payer: Self-pay | Admitting: Family Medicine

## 2013-05-03 ENCOUNTER — Other Ambulatory Visit (HOSPITAL_COMMUNITY): Payer: Self-pay | Admitting: Psychiatry

## 2013-05-21 ENCOUNTER — Ambulatory Visit (INDEPENDENT_AMBULATORY_CARE_PROVIDER_SITE_OTHER): Payer: BC Managed Care – PPO | Admitting: Psychiatry

## 2013-05-21 ENCOUNTER — Encounter (INDEPENDENT_AMBULATORY_CARE_PROVIDER_SITE_OTHER): Payer: Self-pay

## 2013-05-21 ENCOUNTER — Encounter (HOSPITAL_COMMUNITY): Payer: Self-pay | Admitting: Psychiatry

## 2013-05-21 VITALS — BP 116/66 | HR 78 | Wt 160.0 lb

## 2013-05-21 DIAGNOSIS — F3189 Other bipolar disorder: Secondary | ICD-10-CM

## 2013-05-21 DIAGNOSIS — F3181 Bipolar II disorder: Secondary | ICD-10-CM

## 2013-05-21 DIAGNOSIS — F411 Generalized anxiety disorder: Secondary | ICD-10-CM

## 2013-05-21 MED ORDER — LAMOTRIGINE 150 MG PO TABS: ORAL_TABLET | ORAL | Status: DC

## 2013-05-21 MED ORDER — CITALOPRAM HYDROBROMIDE 40 MG PO TABS: 40.0000 mg | ORAL_TABLET | Freq: Every day | ORAL | Status: DC

## 2013-05-21 MED ORDER — BUPROPION HCL 100 MG PO TABS: 100.0000 mg | ORAL_TABLET | Freq: Two times a day (BID) | ORAL | Status: DC

## 2013-05-21 NOTE — Progress Notes (Signed)
[facility] [id number] Progress Note  Cynthia Barrett [id number] 48 y.o.  05/21/2013 4:18 PM  Chief Complaint:  HPI Comments: Cynthia Barrett is  a 48 y/o female with a past psychiatric history significant for major depressive Disorder. The patient is referred for psychiatric services for psychiatric evaluation and medication management.    . Location: The patient reports she has been taking her medications on a regular basis, Nov. 2014.   . Quality:   In the area of affective symptoms, patient appears mildly anxious. Patient denies current suicidal ideation, intent, or plan. Patient denies current homicidal ideation, intent, or plan. Patient denies auditory hallucinations. Patient denies visual hallucinations. Patient denies symptoms of paranoia, but felt paranoid off medications. . Patient states sleep is good, with approximately 6-8 hours of sleep per night. Appetite is increased. Energy level is good. Patient endorses symptoms of anhedonia. Patient denies hopelessness, helplessness, or guilt.   . Severity: Depression: 710 (0=Very depressed; 5=Neutral; 10=Very Happy)  Anxiety- 0/10 (0=no anxiety; 5= moderate/tolerable anxiety; 10= panic attacks) . Duration:  Mood episode-start at age 48 y/o . Timing: No specific timing . Context4- 22 y/o son died in 2011.  . Associated signs and symptoms: The patient recent episodes consistent with hypomania, particularly decreased need for sleep with increased energy, grandiosity, impulsivity, hyperverbal and pressured speech, or increased productivity, financial stressors. Denies any recent symptoms consistent with psychosis, particularly auditory or visual hallucinations, thought broadcasting/insertion/withdrawal, or ideas of reference. Also denies excessive worry to the point of physical symptoms as well as any panic attacks. Denies any history of trauma or symptoms consistent with PTSD such as flashbacks, nightmares, hypervigilance, feelings of  numbness or inability to connect with others.   History of Present Illness: Suicidal Ideation: Negative Plan Formed: Negative Patient has means to carry out plan: Negative  Homicidal Ideation: Negative Plan Formed: Negative Patient has means to carry out plan: Negative  Review of Systems: Psychiatric: Agitation: Negative Hallucination: Negative Depressed Mood: Yes Insomnia: Yes Hypersomnia: No Altered Concentration: No Feels Worthless: No Grandiose Ideas: Yes Belief In Special Powers: No New/Increased Substance Abuse: No Compulsions: No  Neurologic: Headache: Negative Seizure: Negative Paresthesias: Negative  Past Medical Family, Social History:  Past Medical History  Diagnosis Date  . Depression   . Anxiety    Family History  Problem Relation Age of Onset  . Diabetes Mother   . Anxiety disorder Mother   . Diabetes Father   . Schizophrenia Paternal Aunt   . Anxiety disorder Maternal Grandmother    History   Social History  . Marital Status: Married    Spouse Name: N/A    Number of Children: N/A  . Years of Education: N/A   Social History Main Topics  . Smoking status: Never Smoker   . Smokeless tobacco: Never Used  . Alcohol Use: No  . Drug Use: No  . Sexual Activity: Yes    Partners: Male   Other Topics Concern  . None   Social History Narrative  . None    Outpatient Encounter Prescriptions as of 05/21/2013  Medication Sig  . ALPRAZolam (XANAX) 0.5 MG tablet Take 1 tablet (0.5 mg total) by mouth at bedtime as needed.  . citalopram (CELEXA) 10 MG tablet TAKE 1 TABLET DAILY  . CRYSELLE-28 0.3-30 MG-MCG tablet TAKE 1 TABLET BY MOUTH DAILY.  Marland Kitchen. lamoTRIgine (LAMICTAL) 150 MG tablet TAKE 1 TABLET (150 MG TOTAL) BY MOUTH 2 (TWO) TIMES DAILY.    Past Psychiatric History/Hospitalization(s): Anxiety: Yes Bipolar Disorder: No  Depression: Yes Mania: No Psychosis: No Schizophrenia: No Personality Disorder: No Hospitalization for psychiatric illness:  Yes History of Electroconvulsive Shock Therapy: No Prior Suicide Attempts: Yes  Review of Systems  Constitutional: Negative for fever, chills and weight loss.  Respiratory: Negative for cough, hemoptysis and sputum production.   Cardiovascular: Negative for chest pain, palpitations and leg swelling.  Gastrointestinal: Negative for heartburn, nausea, vomiting, abdominal pain, diarrhea and constipation.  Genitourinary: Negative for dysuria and urgency.  Skin: Negative for itching and rash.     Physical Exam: Constitutional:  BP 116/66  Pulse 78  Wt 160 lb (72.576 kg)  LMP 04/30/2013  General Appearance: alert, oriented, no acute distress Musculoskeletal: Gait & Station: normal Patient leans: N/A  Psychiatric Speciality Exam: General Appearance: Casual and Well Groomed  Patent attorney::  Good  Speech:  Clear and Coherent and Normal Rate  Volume:  Normal  Mood:  "good"  Affect:  Appropriate, Congruent and Full Range  Thought Process:  Coherent, Linear and Logical  Orientation:  Full (Time, Place, and Person)  Thought Content:  WDL  Suicidal Thoughts:  No  Homicidal Thoughts:  No  Memory:  Immediate;   Good Recent;   Good Remote;   Good  Judgement:  Good  Insight:  Fair  Psychomotor Activity:  Normal  Concentration:  Good  Recall:  Good  Akathisia:  No  Handed:  Right  AIMS (if indicated):     Assets:  Intimacy Resilience Social Support   language intact and comprehensive    fund of knowledge is good     Assessment: Axis I: Bipolar II Disorder    Plan:  Plan of Care:  PLAN:  1. Affirm with the patient that the medications are taken as ordered. Patient  expressed understanding of how their medications were to be used.    Laboratory: None   Psychotherapy: Therapy: brief supportive therapy provided.  Discussed psychosocial stressors in detail.  Will refer to individual therapy. Continue individual therapy.  Medications:  Continue  the following psychiatric  medications as written prior to this appointment with the following changes::  a) Lamictal 150 mg BID b) Citalopram 40 mg c) Alprazolam -Patient uses rarely for panic attacks-has not used in 3 weeks.  -Risks and benefits, side effects and alternatives discussed with patient, she was given an opportunity to ask questions about her medication, illness, and treatment. All current psychiatric medications have been reviewed and discussed with the patient and adjusted as clinically appropriate. The patient has been provided an accurate and updated list of the medications being now prescribed.   Routine PRN Medications:  Negative  Consultations: The patient was encouraged to keep all PCP and specialty clinic appointments.   Safety Concerns:   Patient told to call clinic if any problems occur. Patient advised to go to  ER  if she should develop SI/HI, side effects, or if symptoms worsen. Has crisis numbers to call if needed.    Other:   8. Patient was instructed to return to clinic in 1 months.  9. The patient was advised to call and cancel their mental health appointment within 24 hours of the appointment, if they are unable to keep the appointment, as well as the three no show and termination from clinic policy. 10. The patient expressed understanding of the plan and agrees with the above.  Time Spent: 25 minutes Jacqulyn Cane, M.D.  05/21/2013 4:18 PM

## 2013-06-23 ENCOUNTER — Encounter (HOSPITAL_COMMUNITY): Payer: Self-pay | Admitting: Psychiatry

## 2013-06-23 ENCOUNTER — Encounter (INDEPENDENT_AMBULATORY_CARE_PROVIDER_SITE_OTHER): Payer: Self-pay

## 2013-06-23 ENCOUNTER — Ambulatory Visit (INDEPENDENT_AMBULATORY_CARE_PROVIDER_SITE_OTHER): Payer: BC Managed Care – PPO | Admitting: Psychiatry

## 2013-06-23 VITALS — BP 112/70 | HR 84 | Wt 159.0 lb

## 2013-06-23 DIAGNOSIS — F3181 Bipolar II disorder: Secondary | ICD-10-CM

## 2013-06-23 DIAGNOSIS — F411 Generalized anxiety disorder: Secondary | ICD-10-CM

## 2013-06-23 DIAGNOSIS — F3189 Other bipolar disorder: Secondary | ICD-10-CM

## 2013-06-23 MED ORDER — BUPROPION HCL ER (XL) 150 MG PO TB24: 150.0000 mg | ORAL_TABLET | ORAL | Status: DC

## 2013-06-23 MED ORDER — BUPROPION HCL 100 MG PO TABS: 100.0000 mg | ORAL_TABLET | Freq: Two times a day (BID) | ORAL | Status: DC

## 2013-06-23 MED ORDER — CITALOPRAM HYDROBROMIDE 40 MG PO TABS: 40.0000 mg | ORAL_TABLET | Freq: Every day | ORAL | Status: DC

## 2013-06-23 NOTE — Progress Notes (Signed)
[facility]  Jolayne Pantherudra M Rhett 05/08/1965 [id number] 48 y.o.  06/23/2013 4:30 PM  Chief Complaint:  HPI Comments: [name]  a 48 y/o female with a past psychiatric history significant for major depressive Disorder. The patient is referred for psychiatric services for psychiatric evaluation and medication management.   . Location: The patient reports she has been taking her medications on a regular basis, Nov. 2014.   . Quality:   In the area of affective symptoms, patient appears mildly anxious. Patient denies current suicidal ideation, intent, or plan. Patient denies current homicidal ideation, intent, or plan. Patient denies auditory hallucinations. Patient denies visual hallucinations. Patient denies symptoms of paranoia, but felt paranoid off medications. . Patient states sleep is good, with approximately 6-8 hours of sleep per night. Appetite is increased. Energy level is good. Patient endorses symptoms of anhedonia. Patient denies hopelessness, helplessness, or guilt.   . Severity: Depression: 8/10 (0=Very depressed; 5=Neutral; 10=Very Happy)  Anxiety- 0/10 (0=no anxiety; 5= moderate/tolerable anxiety; 10= panic attacks) . Duration:  Mood episode-start at age 48 y/o . Timing: No specific timing . Context64- 22 y/o son died in 2011.  . Associated signs and symptoms: The patient recent episodes consistent with hypomania, particularly decreased need for sleep with increased energy, grandiosity, impulsivity, hyperverbal and pressured speech, or increased productivity, financial stressors. Denies any recent symptoms consistent with psychosis, particularly auditory or visual hallucinations, thought broadcasting/insertion/withdrawal, or ideas of reference. Also denies excessive worry to the point of physical symptoms as well as any panic attacks. Denies any history of trauma or symptoms consistent with PTSD such as flashbacks, nightmares,  hypervigilance, feelings of numbness or inability to connect with others.   History of Present Illness: Suicidal Ideation: Negative Plan Formed: Negative Patient has means to carry out plan: Negative  Homicidal Ideation: Negative Plan Formed: Negative Patient has means to carry out plan: Negative  Review of Systems: Psychiatric: Agitation: Negative Hallucination: Negative Depressed Mood: Yes Insomnia: Yes Hypersomnia: No Altered Concentration: No Feels Worthless: No Grandiose Ideas: Yes Belief In Special Powers: No New/Increased Substance Abuse: No Compulsions: No  Neurologic: Headache: Negative Seizure: Negative Paresthesias: Negative  Past Medical Family, Social History:  Past Medical History  Diagnosis Date  . Depression   . Anxiety    Family History  Problem Relation Age of Onset  . Diabetes Mother   . Anxiety disorder Mother   . Diabetes Father   . Schizophrenia Paternal Aunt   . Anxiety disorder Maternal Grandmother    History   Social History  . Marital Status: Married    Spouse Name: N/A    Number of Children: N/A  . Years of Education: N/A   Social History Main Topics  . Smoking status: Never Smoker   . Smokeless tobacco: Never Used  . Alcohol Use: No  . Drug Use: No  . Sexual Activity: Yes    Partners: Male   Other Topics Concern  . Not on file   Social History Narrative  . No narrative on file    Outpatient Encounter Prescriptions as of 06/23/2013  Medication Sig  . ALPRAZolam (XANAX) 0.5 MG tablet Take 1 tablet (0.5 mg total) by mouth at bedtime as needed.  Marland Kitchen. buPROPion (WELLBUTRIN) 100 MG tablet Take 1 tablet (100 mg total) by mouth 2 (two) times daily. Half tablet for 7 days then increase to one tablet daily.  . citalopram (CELEXA) 40 MG tablet Take 1 tablet (40 mg total) by mouth daily. TAKE  1 TABLET DAILY  . CRYSELLE-28 0.3-30 MG-MCG tablet TAKE 1 TABLET BY MOUTH DAILY.  Marland Kitchen lamoTRIgine (LAMICTAL) 150 MG tablet TAKE 1 TABLET (150 MG  TOTAL) BY MOUTH 2 (TWO) TIMES DAILY.    Past Psychiatric History/Hospitalization(s): Anxiety: Yes Bipolar Disorder: No Depression: Yes Mania: No Psychosis: No Schizophrenia: No Personality Disorder: No Hospitalization for psychiatric illness: Yes History of Electroconvulsive Shock Therapy: No Prior Suicide Attempts: Yes  Review of Systems  Constitutional: Negative for fever, chills and weight loss.  Respiratory: Negative for cough, hemoptysis and sputum production.   Cardiovascular: Negative for chest pain, palpitations and leg swelling.  Gastrointestinal: Negative for heartburn, nausea, vomiting, abdominal pain, diarrhea and constipation.  Genitourinary: Negative for dysuria and urgency.  Skin: Negative for itching and rash.     Physical Exam: Constitutional:  BP 112/70  Pulse 84  LMP 06/02/2013  General Appearance: alert, oriented, no acute distress Musculoskeletal: Gait & Station: normal Patient leans: N/A  Psychiatric Speciality Exam: General Appearance: Casual and Well Groomed  Patent attorney::  Good  Speech:  Clear and Coherent and Normal Rate  Volume:  Normal  Mood:  "up"  Affect:  Appropriate, Congruent and Full Range  Thought Process:  Coherent, Linear and Logical  Orientation:  Full (Time, Place, and Person)  Thought Content:  WDL  Suicidal Thoughts:  No  Homicidal Thoughts:  No  Memory:  Immediate;   Good Recent;   Good Remote;   Good  Judgement:  Good  Insight:  Fair  Psychomotor Activity:  Normal  Concentration:  Good  Recall:  Good  Akathisia:  No  Handed:  Right  AIMS (if indicated):     Assets:  Intimacy Resilience Social Support  Language-intact  Fund of knowledge is good     Assessment: Axis I: Bipolar II Disorder-Stable    Plan:  Plan of Care:  PLAN:  1. Affirm with the patient that the medications are taken as ordered. Patient  expressed understanding of how their medications were to be used.    Laboratory: None    Psychotherapy: Therapy: brief supportive therapy provided.  Discussed psychosocial stressors in detail. More than 50% of the visit was spent on individual therapy/counseling.   Medications:  Continue  the following psychiatric medications as written prior to this appointment with the following changes: a) Lamictal 150 mg BID b) Citalopram 40 mg C) Wellbutrin 100  c) Alprazolam -Patient uses rarely for panic attacks-has not used in 3 weeks.  -Risks and benefits, side effects and alternatives discussed with patient, she was given an opportunity to ask questions about her medication, illness, and treatment. All current psychiatric medications have been reviewed and discussed with the patient and adjusted as clinically appropriate. The patient has been provided an accurate and updated list of the medications being now prescribed.   Routine PRN Medications:  Negative  Consultations: The patient was encouraged to keep all PCP and specialty clinic appointments.   Safety Concerns:   Patient told to call clinic if any problems occur. Patient advised to go to  ER  if she should develop SI/HI, side effects, or if symptoms worsen. Has crisis numbers to call if needed.    Other:   8. Patient was instructed to return to clinic in 1 months.  9. The patient was advised to call and cancel their mental health appointment within 24 hours of the appointment, if they are unable to keep the appointment, as well as the three no show and termination from clinic policy. 10.  The patient expressed understanding of the plan and agrees with the above. 11. Patient informed that April 15th, 2015 would be my last day at this clinic.   Time Spent: 25 minutes Jacqulyn Cane, M.D.  06/23/2013 4:30 PM

## 2013-07-14 ENCOUNTER — Other Ambulatory Visit: Payer: Self-pay | Admitting: Family Medicine

## 2013-07-15 ENCOUNTER — Other Ambulatory Visit (HOSPITAL_COMMUNITY): Payer: Self-pay | Admitting: Psychiatry

## 2013-07-15 DIAGNOSIS — F3181 Bipolar II disorder: Secondary | ICD-10-CM

## 2013-07-15 DIAGNOSIS — F900 Attention-deficit hyperactivity disorder, predominantly inattentive type: Secondary | ICD-10-CM

## 2013-07-15 DIAGNOSIS — F411 Generalized anxiety disorder: Secondary | ICD-10-CM

## 2013-07-15 MED ORDER — BUPROPION HCL ER (XL) 150 MG PO TB24: 150.0000 mg | ORAL_TABLET | ORAL | Status: DC

## 2013-07-30 ENCOUNTER — Encounter: Payer: Self-pay | Admitting: Family Medicine

## 2013-08-03 ENCOUNTER — Ambulatory Visit (HOSPITAL_COMMUNITY): Payer: Self-pay | Admitting: Psychiatry

## 2013-08-17 ENCOUNTER — Encounter: Payer: Self-pay | Admitting: Family Medicine

## 2013-08-17 ENCOUNTER — Ambulatory Visit (INDEPENDENT_AMBULATORY_CARE_PROVIDER_SITE_OTHER): Payer: BC Managed Care – PPO | Admitting: Family Medicine

## 2013-08-17 ENCOUNTER — Other Ambulatory Visit (HOSPITAL_COMMUNITY)
Admission: RE | Admit: 2013-08-17 | Discharge: 2013-08-17 | Disposition: A | Discharge status: Home / Self Care | Payer: BC Managed Care – PPO | Source: Ambulatory Visit | Attending: Family Medicine | Admitting: Family Medicine

## 2013-08-17 VITALS — BP 126/71 | HR 84 | Ht 61.0 in | Wt 152.0 lb

## 2013-08-17 DIAGNOSIS — Z23 Encounter for immunization: Secondary | ICD-10-CM

## 2013-08-17 DIAGNOSIS — Z1231 Encounter for screening mammogram for malignant neoplasm of breast: Secondary | ICD-10-CM

## 2013-08-17 DIAGNOSIS — Z0000 Encounter for general adult medical examination without abnormal findings: Secondary | ICD-10-CM

## 2013-08-17 DIAGNOSIS — Z1151 Encounter for screening for human papillomavirus (HPV): Secondary | ICD-10-CM | POA: Insufficient documentation

## 2013-08-17 DIAGNOSIS — Z124 Encounter for screening for malignant neoplasm of cervix: Secondary | ICD-10-CM

## 2013-08-17 DIAGNOSIS — Z01419 Encounter for gynecological examination (general) (routine) without abnormal findings: Secondary | ICD-10-CM | POA: Insufficient documentation

## 2013-08-17 MED ORDER — PROVENTIL HFA 108 (90 BASE) MCG/ACT IN AERS: 2.0000 | INHALATION_SPRAY | RESPIRATORY_TRACT | Status: DC | PRN

## 2013-08-17 NOTE — Progress Notes (Signed)
  Subjective:     Cynthia Barrett is a 48 y.o. female and is here for a comprehensive physical exam. The patient reports no problems.  History   Social History  . Marital Status: Married    Spouse Name: N/A    Number of Children: N/A  . Years of Education: N/A   Occupational History  . Not on file.   Social History Main Topics  . Smoking status: Never Smoker   . Smokeless tobacco: Never Used  . Alcohol Use: No  . Drug Use: No  . Sexual Activity: Yes    Partners: Male   Other Topics Concern  . Not on file   Social History Narrative  . No narrative on file   Health Maintenance  Topic Date Due  . Pap Smear  04/03/2013  . Mammogram  08/06/2013  . Influenza Vaccine  11/21/2013  . Tetanus/tdap  01/30/2017    The following portions of the patient's history were reviewed and updated as appropriate: allergies, current medications, past family history, past medical history, past social history, past surgical history and problem list.  Review of Systems A comprehensive review of systems was negative.   Objective:    BP 126/71  Pulse 84  Ht 5\' 1"  (1.549 m)  Wt 152 lb (68.947 kg)  BMI 28.74 kg/m2 General appearance: alert, cooperative and appears stated age Head: Normocephalic, without obvious abnormality, atraumatic Eyes: conj clear, EOMi, PEERLA Ears: normal TM's and external ear canals both ears Nose: Nares normal. Septum midline. Mucosa normal. No drainage or sinus tenderness. Throat: lips, mucosa, and tongue normal; teeth and gums normal Neck: no adenopathy, no carotid bruit, no JVD, supple, symmetrical, trachea midline and thyroid not enlarged, symmetric, no tenderness/mass/nodules Back: symmetric, no curvature. ROM normal. No CVA tenderness. Lungs: clear to auscultation bilaterally Breasts: normal appearance, no masses or tenderness Heart: regular rate and rhythm, S1, S2 normal, no murmur, click, rub or gallop Abdomen: soft, non-tender; bowel sounds normal; no  masses,  no organomegaly Pelvic: cervix normal in appearance, external genitalia normal, no adnexal masses or tenderness, no cervical motion tenderness, rectovaginal septum normal, uterus normal size, shape, and consistency and vagina normal without discharge Extremities: extremities normal, atraumatic, no cyanosis or edema Pulses: 2+ and symmetric Skin: Skin color, texture, turgor normal. No rashes or lesions Lymph nodes: Cervical, supraclavicular, and axillary nodes normal. Neurologic: Alert and oriented X 3, normal strength and tone. Normal symmetric reflexes. Normal coordination and gait    Assessment:    Healthy female exam.      Plan:     See After Visit Summary for Counseling Recommendations  Keep up a regular exercise program and make sure you are eating a healthy diet Try to eat 4 servings of dairy a day, or if you are lactose intolerant take a calcium with vitamin D daily.  Your vaccines are up to date.   Will call if Pap smear results in about one week.  Asthma-currently well controlled. She's currently out of albuterol inhaler would like a new one called the pharmacy. Call if any problems or concerns.  Due for screening lipid and CMP today.  Mammogram is up-to-date. She's post repeat in 6 months because of the complex cyst seen on the right breast.

## 2013-08-19 LAB — COMPLETE METABOLIC PANEL WITH GFR
ALK PHOS: 62 U/L (ref 39–117)
ALT: 24 U/L (ref 0–35)
AST: 21 U/L (ref 0–37)
Albumin: 4 g/dL (ref 3.5–5.2)
BILIRUBIN TOTAL: 0.6 mg/dL (ref 0.2–1.2)
BUN: 21 mg/dL (ref 6–23)
CO2: 25 mEq/L (ref 19–32)
Calcium: 8.7 mg/dL (ref 8.4–10.5)
Chloride: 101 mEq/L (ref 96–112)
Creat: 0.93 mg/dL (ref 0.50–1.10)
GFR, Est African American: 85 mL/min
GFR, Est Non African American: 73 mL/min
Glucose, Bld: 74 mg/dL (ref 70–99)
Potassium: 3.8 mEq/L (ref 3.5–5.3)
Sodium: 135 mEq/L (ref 135–145)
TOTAL PROTEIN: 6.8 g/dL (ref 6.0–8.3)

## 2013-08-19 LAB — LIPID PANEL
CHOL/HDL RATIO: 3 ratio
Cholesterol: 206 mg/dL — ABNORMAL HIGH (ref 0–200)
HDL: 69 mg/dL (ref >39)
LDL CALC: 117 mg/dL — AB (ref 0–99)
Triglycerides: 100 mg/dL (ref <150)
VLDL: 20 mg/dL (ref 0–40)

## 2013-09-22 ENCOUNTER — Telehealth: Payer: Self-pay | Admitting: Family Medicine

## 2013-09-22 NOTE — Telephone Encounter (Signed)
Please call patient reminders she is due for 6 month followup mammogram. We did get a letter from [facility] because they have evidently attempted to contact her several times and has not been able to get in touch with her. Please have her call [phone] to schedule with the [facility].

## 2013-09-23 NOTE — Telephone Encounter (Signed)
Left message for patient to return call.

## 2013-09-23 NOTE — Telephone Encounter (Signed)
Pt called back and stated that she will call [facility]. Cynthia Barrett

## 2013-11-05 ENCOUNTER — Ambulatory Visit (INDEPENDENT_AMBULATORY_CARE_PROVIDER_SITE_OTHER): Payer: Self-pay

## 2013-11-05 ENCOUNTER — Other Ambulatory Visit: Payer: Self-pay | Admitting: Adult Health

## 2013-11-05 DIAGNOSIS — M545 Low back pain, unspecified: Secondary | ICD-10-CM

## 2014-02-05 ENCOUNTER — Encounter (INDEPENDENT_AMBULATORY_CARE_PROVIDER_SITE_OTHER): Payer: Self-pay

## 2014-02-05 ENCOUNTER — Ambulatory Visit (INDEPENDENT_AMBULATORY_CARE_PROVIDER_SITE_OTHER): Payer: BC Managed Care – PPO | Admitting: Psychiatry

## 2014-02-05 ENCOUNTER — Encounter (HOSPITAL_COMMUNITY): Payer: Self-pay | Admitting: Psychiatry

## 2014-02-05 VITALS — BP 120/85 | HR 75 | Ht 61.0 in | Wt 150.0 lb

## 2014-02-05 DIAGNOSIS — F3181 Bipolar II disorder: Secondary | ICD-10-CM

## 2014-02-05 DIAGNOSIS — F411 Generalized anxiety disorder: Secondary | ICD-10-CM

## 2014-02-05 MED ORDER — CITALOPRAM HYDROBROMIDE 40 MG PO TABS: 40.0000 mg | ORAL_TABLET | Freq: Every day | ORAL | Status: DC

## 2014-02-05 MED ORDER — LAMOTRIGINE 150 MG PO TABS: ORAL_TABLET | ORAL | Status: DC

## 2014-02-05 NOTE — Progress Notes (Signed)
Patient ID: Cynthia Barrett, female   DOB: 1966/04/09, 48 y.o.   MRN: [id number]   [facility] Follow-up Outpatient Visit  Cynthia Barrett 1965/09/23 [id number] 48 y.o.  02/05/2014 10:14 AM  Chief Complaint:  HPI Comments: [name] is  a 48 y/o female with a past psychiatric history significant for major depressive Disorder. The patient is referred for psychiatric services for psychiatric evaluation and medication management.   . Location: The patient reports she has been taking her medications on a regular basis and has not cut down. She is not on wellbutrin which was tried for some inattention. Her inattention is more in control when her mood is baseline. Tolerating meds including lamictal with no rash. Works full time and has one kid 12years old other 3 are grown and independent.    . Quality:   In the area of affective symptoms, patient appears mildly anxious. Patient denies current suicidal ideation, intent, or plan. Patient denies current homicidal ideation, intent, or plan. Patient denies auditory hallucinations. Patient denies visual hallucinations. Patient denies symptoms of paranoia, but felt paranoid off medications. . Patient states sleep is good, with approximately 6-8 hours of sleep per night. Appetite is increased. Energy level is good. Patient endorses symptoms of anhedonia. Patient denies hopelessness, helplessness, or guilt.   . Severity: Depression: 8/10 (0=Very depressed; 5=Neutral; 10=Very Happy)  Anxiety- 0/10 (0=no anxiety; 5= moderate/tolerable anxiety; 10= panic attacks) . Duration:  Mood episode-start at age 83 y/o . Timing: No specific timing . Context- 28 y/o son died in 22-Sep-2009.  . Associated signs and symptoms: The patient recent episodes consistent with hypomania, particularly decreased need for sleep with increased energy, grandiosity, impulsivity, hyperverbal and pressured speech, or increased productivity, financial stressors. Denies any recent  symptoms consistent with psychosis, particularly auditory or visual hallucinations, thought broadcasting/insertion/withdrawal, or ideas of reference. Also denies excessive worry to the point of physical symptoms as well as any panic attacks. Denies any history of trauma or symptoms consistent with PTSD such as flashbacks, nightmares, hypervigilance, feelings of numbness or inability to connect with others.   History of Present Illness: Suicidal Ideation: Negative Plan Formed: Negative Patient has means to carry out plan: Negative  Homicidal Ideation: Negative Plan Formed: Negative Patient has means to carry out plan: Negative    Neurologic: Headache: Negative Seizure: Negative Paresthesias: Negative  Past Medical Family, Social History:  Past Medical History  Diagnosis Date  . Depression   . Anxiety    Family History  Problem Relation Age of Onset  . Diabetes Mother   . Anxiety disorder Mother   . Diabetes Father   . Schizophrenia Paternal Aunt   . Anxiety disorder Maternal Grandmother    History   Social History  . Marital Status: Married    Spouse Name: N/A    Number of Children: N/A  . Years of Education: N/A   Social History Main Topics  . Smoking status: Never Smoker   . Smokeless tobacco: Never Used  . Alcohol Use: No  . Drug Use: No  . Sexual Activity: Yes    Partners: Male   Other Topics Concern  . None   Social History Narrative  . None    Outpatient Encounter Prescriptions as of 02/05/2014  Medication Sig  . citalopram (CELEXA) 40 MG tablet Take 1 tablet (40 mg total) by mouth daily. TAKE 1 TABLET DAILY  . CRYSELLE-28 0.3-30 MG-MCG tablet TAKE 1 TABLET BY MOUTH DAILY.  Marland Kitchen lamoTRIgine (LAMICTAL) 150 MG tablet TAKE  1 TABLET (150 MG TOTAL) BY MOUTH 2 (TWO) TIMES DAILY.  Marland Kitchen. PROVENTIL HFA 108 (90 BASE) MCG/ACT inhaler Inhale 2 puffs into the lungs every 4 (four) hours as needed for wheezing or shortness of breath.  . [DISCONTINUED] buPROPion (WELLBUTRIN XL)  150 MG 24 hr tablet Take 1 tablet (150 mg total) by mouth every morning.  . [DISCONTINUED] citalopram (CELEXA) 40 MG tablet Take 1 tablet (40 mg total) by mouth daily. TAKE 1 TABLET DAILY  . [DISCONTINUED] lamoTRIgine (LAMICTAL) 150 MG tablet TAKE 1 TABLET (150 MG TOTAL) BY MOUTH 2 (TWO) TIMES DAILY.     Review of Systems  Constitutional: Negative for fever.  Respiratory: Negative for cough.   Cardiovascular: Negative for chest pain and leg swelling.  Gastrointestinal: Negative for nausea and diarrhea.  Skin: Negative for itching and rash.  Psychiatric/Behavioral: Negative for depression, suicidal ideas, hallucinations and substance abuse. The patient is not nervous/anxious.      Physical Exam: Constitutional:  BP 120/85  Pulse 75  Ht 5\' 1"  (1.549 m)  Wt 150 lb (68.04 kg)  BMI 28.36 kg/m2  General Appearance: alert, oriented, no acute distress Musculoskeletal: Gait & Station: normal Patient leans: N/A  Psychiatric Speciality Exam: General Appearance: Casual and Well Groomed  Patent attorneyye Contact::  Good  Speech:  Clear and Coherent and Normal Rate  Volume:  Normal  Mood:  "up"  Affect:  Appropriate, Congruent and Full Range  Thought Process:  Coherent, Linear and Logical  Orientation:  Full (Time, Place, and Person)  Thought Content:  WDL  Suicidal Thoughts:  No  Homicidal Thoughts:  No  Memory:  Immediate;   Good Recent;   Good Remote;   Good  Judgement:  Good  Insight:  Fair  Psychomotor Activity:  Normal  Concentration:  Good  Recall:  Good  Akathisia:  No  Handed:  Right  AIMS (if indicated):     Assets:  Intimacy Resilience Social Support  Language-intact  Fund of knowledge is good     Assessment: Axis I: Bipolar II Disorder-Stable    Plan:  Plan of Care:  PLAN:  1. Affirm with the patient that the medications are taken as ordered. Patient  expressed understanding of how their medications were to be used.    Laboratory: None   Psychotherapy:  Therapy: brief supportive therapy provided.  Discussed psychosocial stressors in detail. More than 50% of the visit was spent on individual therapy/counseling.   Medications:  Continue  the following psychiatric medications as written prior to this appointment with the following changes: a) Lamictal 150 mg BID b) Citalopram 40 mg Stop Wellbutrin. Not taking  c) Alprazolam -Patient uses rarely for panic attacks-has not used in 2 months.  -Risks and benefits, side effects and alternatives discussed with patient, she was given an opportunity to ask questions about her medication, illness, and treatment. All current psychiatric medications have been reviewed and discussed with the patient and adjusted as clinically appropriate. The patient has been provided an accurate and updated list of the medications being now prescribed.   Routine PRN Medications:  Negative  Consultations: The patient was encouraged to keep all PCP and specialty clinic appointments.   Safety Concerns:   Patient told to call clinic if any problems occur. Patient advised to go to  ER  if she should develop SI/HI, side effects, or if symptoms worsen. Has crisis numbers to call if needed.    Other:   8. Patient was instructed to return to clinic in 2  months.  9. The patient was advised to call and cancel their mental health appointment within 24 hours of the appointment, if they are unable to keep the appointment, as well as the three no show and termination from clinic policy. 10. The patient expressed understanding of the plan and agrees with the above.    Time Spent: 25 minutes Thresa RossNADEEM Denny Mccree, M.D.  02/05/2014 10:14 AM

## 2014-05-06 ENCOUNTER — Encounter (HOSPITAL_COMMUNITY): Payer: Self-pay | Admitting: Psychiatry

## 2014-05-06 ENCOUNTER — Ambulatory Visit (INDEPENDENT_AMBULATORY_CARE_PROVIDER_SITE_OTHER): Payer: Self-pay | Admitting: Psychiatry

## 2014-05-06 ENCOUNTER — Other Ambulatory Visit (HOSPITAL_COMMUNITY): Payer: Self-pay | Admitting: Psychiatry

## 2014-05-06 VITALS — BP 126/90 | HR 80 | Ht 61.0 in | Wt 160.0 lb

## 2014-05-06 DIAGNOSIS — F3181 Bipolar II disorder: Secondary | ICD-10-CM

## 2014-05-06 DIAGNOSIS — F411 Generalized anxiety disorder: Secondary | ICD-10-CM

## 2014-05-06 MED ORDER — ARIPIPRAZOLE 10 MG PO TABS: 10.0000 mg | ORAL_TABLET | Freq: Every day | ORAL | Status: DC

## 2014-05-06 NOTE — Progress Notes (Signed)
Patient ID: RICA HEATHER, female   DOB: 28-Nov-1965, 49 y.o.   MRN: [id number]   [facility] Follow-up Outpatient Visit  Cynthia Barrett 28-Jan-1966 [id number] 49 y.o.  05/06/2014 4:27 PM  Chief Complaint:  HPI Comments: Cynthia Barrett is  a 49 y/o female with a past psychiatric history significant for major depressive Disorder. The patient is referred for psychiatric services for psychiatric evaluation and medication management.   . Location: The patient reports she has been taking her medications on a regular basis and has not cut down.   . Quality:   In the area of affective symptoms, patient appears mildly anxious and depressed. Patient denies current suicidal ideation, intent, or plan. Patient denies current homicidal ideation, intent, or plan. Patient denies auditory hallucinations. Patient denies visual hallucinations. Patient denies symptoms of paranoia, but felt paranoid off medications. . Patient states sleep is good, with approximately 6-8 hours of sleep per night. Appetite is increased. Energy level is reasonable.  Patient endorses symptoms of anhedonia. Patient denies hopelessness, helplessness, or guilt.  She is having job stress not liking the coworkers otherwise she does do her job reasonable. She brought her husband so that he can explain and listen. She is having episodes of increased energy getting distracted easily racing thoughts that would last for one week or so. She then crashes into depression, feeling withdrawn anhedonia, decreased energy and concentration and not able to do her regular activities at home. Says that her husband does not understand when she goes into that place so I did explain she may be suffering from hypomania and depression.  Medication the husband mentioned that the brother states are less frequent than in the past. He has seen her worrying more and feeling down,  she is concerned about her past but she does not want to talk about it or do  therapy. . Severity: Depression: 6/10 (0=Very depressed; 5=Neutral; 10=Very Happy)  Anxiety- 2/10 (0=no anxiety; 5= moderate/tolerable anxiety; 10= panic attacks) . Duration:  Mood episode-start at age 76 y/o . Timing: No specific timing . Context- 14 y/o son died in 24-Aug-2009.  . Associated signs and symptoms: The patient recent episodes consistent with hypomania, particularly decreased need for sleep with increased energy, grandiosity, impulsivity, hyperverbal and pressured speech, or increased productivity, financial stressors. Denies any recent symptoms consistent with psychosis, particularly auditory or visual hallucinations, thought broadcasting/insertion/withdrawal, or ideas of reference. Also denies excessive worry to the point of physical symptoms as well as any panic attacks. Denies any history of trauma or symptoms consistent with PTSD such as flashbacks, nightmares, hypervigilance, feelings of numbness or inability to connect with others.   History of Present Illness: Suicidal Ideation: Negative Plan Formed: Negative Patient has means to carry out plan: Negative  Homicidal Ideation: Negative Plan Formed: Negative Patient has means to carry out plan: Negative    Neurologic: Headache: Negative Seizure: Negative Paresthesias: Negative  Past Medical Family, Social History:  Past Medical History  Diagnosis Date  . Depression   . Anxiety    Family History  Problem Relation Age of Onset  . Diabetes Mother   . Anxiety disorder Mother   . Diabetes Father   . Schizophrenia Paternal Aunt   . Anxiety disorder Maternal Grandmother    History   Social History  . Marital Status: Married    Spouse Name: N/A    Number of Children: N/A  . Years of Education: N/A   Social History Main Topics  . Smoking status: Never Smoker   .  Smokeless tobacco: Never Used  . Alcohol Use: No  . Drug Use: No  . Sexual Activity:    Partners: Male   Other Topics Concern  . None   Social  History Narrative    Outpatient Encounter Prescriptions as of 05/06/2014  Medication Sig  . ARIPiprazole (ABILIFY) 10 MG tablet Take 1 tablet (10 mg total) by mouth daily.  . citalopram (CELEXA) 40 MG tablet Take 1 tablet (40 mg total) by mouth daily. TAKE 1 TABLET DAILY  . CRYSELLE-28 0.3-30 MG-MCG tablet TAKE 1 TABLET BY MOUTH DAILY.  Marland Kitchen lamoTRIgine (LAMICTAL) 150 MG tablet TAKE 1 TABLET (150 MG TOTAL) BY MOUTH 2 (TWO) TIMES DAILY.  Marland Kitchen PROVENTIL HFA 108 (90 BASE) MCG/ACT inhaler Inhale 2 puffs into the lungs every 4 (four) hours as needed for wheezing or shortness of breath.     Review of Systems  Constitutional: Negative.   Gastrointestinal: Negative for nausea.  Skin: Negative for rash.  Psychiatric/Behavioral: Positive for depression. The patient is nervous/anxious.      Physical Exam: Constitutional:  BP 126/90 mmHg  Pulse 80  Ht  (1.549 m)  Wt 160 lb (72.576 kg)  BMI 30.25 kg/m2  General Appearance: alert, oriented, no acute distress Musculoskeletal: Gait & Station: normal Patient leans: N/A  Psychiatric Speciality Exam: General Appearance: Casual and Well Groomed  Patent attorney::  Good  Speech:  Clear and Coherent and Normal Rate  Volume:  Normal  Mood:  Down, dysphoric  Affect:  constricted  Thought Process:  Coherent, Linear and Logical  Orientation:  Full (Time, Place, and Person)  Thought Content:  WDL  Suicidal Thoughts:  No  Homicidal Thoughts:  No  Memory:  Immediate;   Good Recent;   Good Remote;   Good  Judgement:  Good  Insight:  Fair  Psychomotor Activity:  Normal  Concentration:  Good  Recall:  Good  Akathisia:  No  Handed:  Right  AIMS (if indicated):     Assets:  Intimacy Resilience Social Support  Language-intact  Fund of knowledge is good     Assessment: Axis I: Bipolar II Disorder- depressed    Plan:  Plan of Care:  PLAN:  1. Affirm with the patient that the medications are taken as ordered. Patient  expressed  understanding of how their medications were to be used.    Laboratory: None   Psychotherapy: Therapy: brief supportive therapy provided.  Discussed psychosocial stressors in detail. More than 50% of the visit was spent on individual therapy/counseling.   Medications:  Continue  the following psychiatric medications as written prior to this appointment with the following changes: a) Lamictal 150 mg BID b) Citalopram 40 mg Add abilify  for mood stabilization.  c) Alprazolam -Patient uses rarely for panic attacks-has not used in 2 months.  -Risks and benefits, side effects and alternatives discussed with patient, she was given an opportunity to ask questions about her medication, illness, and treatment. All current psychiatric medications have been reviewed and discussed with the patient and adjusted as clinically appropriate. The patient has been provided an accurate and updated list of the medications being now prescribed.   Routine PRN Medications:  Negative  Consultations: The patient was encouraged to keep all PCP and specialty clinic appointments.   Safety Concerns:   Patient told to call clinic if any problems occur. Patient advised to go to  ER  if she should develop SI/HI, side effects, or if symptoms worsen. Has crisis numbers to call if needed.  Other:   8. Patient was instructed to return to clinic in 2 weeks.   9. The patient was advised to call and cancel their mental health appointment within 24 hours of the appointment, if they are unable to keep the appointment, as well as the three no show and termination from clinic policy. 10. The patient expressed understanding of the plan and agrees with the above. Not want to be in therapy, i did encourage that it would be helpful.  Husband or patient to call if any symptoms worsen.    Time Spent: 25 minutes Thresa RossNADEEM Tais Koestner, M.D.  05/06/2014 4:27 PM

## 2014-05-11 MED ORDER — QUETIAPINE FUMARATE 100 MG PO TABS: ORAL_TABLET | ORAL | Status: DC

## 2014-05-11 NOTE — Telephone Encounter (Signed)
LM for pt to return call to office

## 2014-05-11 NOTE — Telephone Encounter (Signed)
Pt return phone call to office. Per Dr. Gilmore LarocheAkhtar, please advise pt she will begin Seroquel 100mg . Please instruct pt to take half a tablet for first two days, then one tablet daily. Will call prescription into [facility], [address] Pt states and shows understanding. Pt has f/u appt on 1/28.

## 2014-05-11 NOTE — Telephone Encounter (Signed)
Abilify (and generic) are too expensive. Pt would like you to prescribe something different

## 2014-05-20 ENCOUNTER — Encounter (INDEPENDENT_AMBULATORY_CARE_PROVIDER_SITE_OTHER): Payer: Self-pay

## 2014-05-20 ENCOUNTER — Encounter (HOSPITAL_COMMUNITY): Payer: Self-pay | Admitting: Psychiatry

## 2014-05-20 ENCOUNTER — Ambulatory Visit (INDEPENDENT_AMBULATORY_CARE_PROVIDER_SITE_OTHER): Payer: Self-pay | Admitting: Psychiatry

## 2014-05-20 VITALS — BP 120/80 | HR 88 | Ht 61.0 in | Wt 160.0 lb

## 2014-05-20 DIAGNOSIS — F3181 Bipolar II disorder: Secondary | ICD-10-CM

## 2014-05-20 DIAGNOSIS — F411 Generalized anxiety disorder: Secondary | ICD-10-CM

## 2014-05-20 MED ORDER — QUETIAPINE FUMARATE 100 MG PO TABS: ORAL_TABLET | ORAL | Status: DC

## 2014-05-20 NOTE — Progress Notes (Signed)
Patient ID: Cynthia Barrett, female   DOB: 10/10/1965, 49 y.o.   MRN: [id number]   [facility]  Cynthia Barrett 07/01/1965 [id number] 49 y.o.  05/20/2014 4:10 PM  Chief Complaint:  HPI Comments: Mrs. Cynthia Barrett is  a 49 y/o female with a past psychiatric history significant for major depressive Disorder. The patient is referred for psychiatric services for psychiatric evaluation and medication management.   . Location: The patient reports she has been taking her medications on a regular basis and did not fill up abilify due to cost. We changed it to seroquel and now is at 100mg  qhs.   . Quality:  Last visit she was having significant anxiety and racing thoughts. Difficulty dealing with her coworkers and she exploded and was tearful. She was having decreased sleep and was feeling she cannot finish projects but she starts something and she cannot finish. She was here with her husband. We started her on Abilify as a mood stabilizer considering she may be hypomanic. She is unable to afford Abilify be changed to Seroquel and she is doing better now she feels her sleep and rest always have increased her concentration is improved she is not trying to finish to many things at a time. Her issues with her coworkers also seldom and she is not feeling agitated. Her husband is not here with her.  In the area of affective symptoms, patient appears mildly anxious . Patient denies current suicidal ideation, intent, or plan. Patient denies current homicidal ideation, intent, or plan. Patient denies auditory hallucinations. Patient denies visual hallucinations. Patient denies symptoms of paranoia, but felt paranoid off medications. . Patient states sleep is good, with approximately 6-8 hours of sleep per night. Appetite is increased. Energy level is reasonable.    Medication the husband mentioned that the brother states are less frequent than in the past. He has seen her worrying more  and feeling down,  she is concerned about her past but she does not want to talk about it or do therapy. . Severity: Depression: 7/10 (0=Very depressed; 5=Neutral; 10=Very Happy)  Anxiety- 2/10 (0=no anxiety; 5= moderate/tolerable anxiety; 10= panic attacks) . Duration:  Mood episode-start at age 49 y/o . Timing: No specific timing . Context76- 22 y/o son died in 2011.    History of Present Illness: Suicidal Ideation: Negative Plan Formed: Negative Patient has means to carry out plan: Negative  Homicidal Ideation: Negative Plan Formed: Negative Patient has means to carry out plan: Negative    Neurologic: Headache: Negative Seizure: Negative Paresthesias: Negative  Past Medical Family, Social History:  Past Medical History  Diagnosis Date  . Depression   . Anxiety    Family History  Problem Relation Age of Onset  . Diabetes Mother   . Anxiety disorder Mother   . Diabetes Father   . Schizophrenia Paternal Aunt   . Anxiety disorder Maternal Grandmother    History   Social History  . Marital Status: Married    Spouse Name: N/A    Number of Children: N/A  . Years of Education: N/A   Social History Main Topics  . Smoking status: Never Smoker   . Smokeless tobacco: Never Used  . Alcohol Use: No  . Drug Use: No  . Sexual Activity:    Partners: Male   Other Topics Concern  . None   Social History Narrative    Outpatient Encounter Prescriptions as of 05/20/2014  Medication Sig  . citalopram (CELEXA) 40 MG tablet  TAKE 1 TABLET (40 MG TOTAL) BY MOUTH DAILY. TAKE 1 TABLET DAILY  . CRYSELLE-28 0.3-30 MG-MCG tablet TAKE 1 TABLET BY MOUTH DAILY.  Marland Kitchen lamoTRIgine (LAMICTAL) 150 MG tablet TAKE 1 TABLET (150 MG TOTAL) BY MOUTH 2 (TWO) TIMES DAILY.  Marland Kitchen PROVENTIL HFA 108 (90 BASE) MCG/ACT inhaler Inhale 2 puffs into the lungs every 4 (four) hours as needed for wheezing or shortness of breath.  . QUEtiapine (SEROQUEL) 100 MG tablet Take half tablet for first 2 days then one tablet  a night. Caution about sedation effect.  . [DISCONTINUED] QUEtiapine (SEROQUEL) 100 MG tablet Take half tablet for first 2 days then one tablet a night. Caution about sedation effect.     Review of Systems  Constitutional: Negative.   Cardiovascular: Negative for chest pain.  Skin: Negative for rash.  Neurological: Negative for tremors.  Psychiatric/Behavioral: Negative for depression. The patient is not nervous/anxious.      Physical Exam: Constitutional:  BP 120/80 mmHg  Pulse 88  Ht  (1.549 m)  Wt 160 lb (72.576 kg)  BMI 30.25 kg/m2  General Appearance: alert, oriented, no acute distress Musculoskeletal: Gait & Station: normal Patient leans: N/A  Psychiatric Speciality Exam: General Appearance: Casual and Well Groomed  Patent attorney::  Good  Speech:  Clear and Coherent and Normal Rate  Volume:  Normal  Mood:  Euthymic. Not hopeless  Affect:  constricted  Thought Process:  Coherent, Linear and Logical  Orientation:  Full (Time, Place, and Person)  Thought Content:  WDL  Suicidal Thoughts:  No  Homicidal Thoughts:  No  Memory:  Immediate;   Good Recent;   Good Remote;   Good  Judgement:  Good  Insight:  Fair  Psychomotor Activity:  Normal  Concentration:  Good  Recall:  Good  Akathisia:  No  Handed:  Right  AIMS (if indicated):     Assets:  Intimacy Resilience Social Clinical biochemist of knowledge is good     Assessment: Axis I: Bipolar II Disorder- depressed    Plan:  Plan of Care:  PLAN:  1. Affirm with the patient that the medications are taken as ordered. Patient  expressed understanding of how their medications were to be used.    Laboratory: None   Psychotherapy: Therapy: brief supportive therapy provided.  Discussed psychosocial stressors in detail. More than 50% of the visit was spent on individual therapy/counseling.   Medications:  Continue  the following psychiatric medications as written prior to this appointment  with the following changes: a) Lamictal 150 mg BID b) Citalopram 40 mg Did not start abilify due to cost.  Continue seroquel  qhs. AIMS is zero  Routine PRN Medications:  Negative  Consultations: The patient was encouraged to keep all PCP and specialty clinic appointments.   Safety Concerns:   Patient told to call clinic if any problems occur. Patient advised to go to  ER  if she should develop SI/HI, side effects, or if symptoms worsen. Has crisis numbers to call if needed.    Other:   8. Patient was instructed to return to clinic in 3 weeks.   9. The patient was advised to call and cancel their mental health appointment within 24 hours of the appointment, if they are unable to keep the appointment, as well as the three no show and termination from clinic policy. 10. The patient expressed understanding of the plan and agrees with the above. Not want to be in therapy, i did encourage  that it would be helpful.  Husband or patient to call if any symptoms worsen.     Thresa Ross, M.D.  05/20/2014 4:10 PM

## 2014-06-04 ENCOUNTER — Other Ambulatory Visit (HOSPITAL_COMMUNITY): Payer: Self-pay | Admitting: *Deleted

## 2014-06-04 DIAGNOSIS — F411 Generalized anxiety disorder: Secondary | ICD-10-CM

## 2014-06-04 DIAGNOSIS — F3181 Bipolar II disorder: Secondary | ICD-10-CM

## 2014-06-04 MED ORDER — LAMOTRIGINE 150 MG PO TABS: ORAL_TABLET | ORAL | Status: DC

## 2014-06-04 NOTE — Telephone Encounter (Signed)
PT called for refill for Celexa 40mg  and Lamictal 150mg . Per Dr. Gilmore LarocheAkhtar, pt is authorized for a refill for Lamictal 150 mg, Qty 180. Pt had a refill for Celexa 40mg  on 05/11/14. Called and informed pt only the prescription for Lamictal 150mg , Qty 180 [facility]. Prescription for Celexa is too early to fill at this time. Pt verbally understand. Pt has f/u appt on 2/25

## 2014-06-17 ENCOUNTER — Encounter (HOSPITAL_COMMUNITY): Payer: Self-pay | Admitting: Psychiatry

## 2014-06-17 ENCOUNTER — Ambulatory Visit (INDEPENDENT_AMBULATORY_CARE_PROVIDER_SITE_OTHER): Payer: Self-pay | Admitting: Psychiatry

## 2014-06-17 VITALS — BP 131/80 | HR 80 | Ht 61.0 in | Wt 170.0 lb

## 2014-06-17 DIAGNOSIS — F3181 Bipolar II disorder: Secondary | ICD-10-CM

## 2014-06-17 DIAGNOSIS — F411 Generalized anxiety disorder: Secondary | ICD-10-CM

## 2014-06-17 MED ORDER — QUETIAPINE FUMARATE 100 MG PO TABS: ORAL_TABLET | ORAL | Status: DC

## 2014-06-17 NOTE — Progress Notes (Signed)
Patient ID: Jolayne Pantherudra M Canniff, female   DOB: 11/02/1965, 49 y.o.   MRN: [id number]   [facility]  Jolayne Pantherudra M Roessner 05/13/1965 [id number] 49 y.o.  06/17/2014 3:35 PM  Chief Complaint:  HPI Comments: [name]  a 49 y/o female with a past psychiatric history significant for Bipolar disorder. The patient is referred for psychiatric services for psychiatric evaluation and medication management.    last visit we have added Seroquel as a mood stabilizer. That is felt she is sleeping better with snoring. Mood wise she is improved not hopeless or depressed she continues to go the medication but on Lamictal and Celexa. There is no recent incident at work. One thing that keeps bothering her is that one of her daughters involved in drugs and she has 4 daughters of her own. Patient is to take care of her daughters and patient wants her daughter to get help but she was reluctant to get herself evaluated for possible bipolar.  There is no involuntary movements noticeable. Patient cut down the Seroquel to half a tablet and she still sleeps reasonable now and is struggling less with the lower dose. She understands she is to work on weight loss and if needed get an evaluation for sleep study. . Severity: Depression: 7/10 (0=Very depressed; 5=Neutral; 10=Very Happy)  Anxiety- 2/10 (0=no anxiety; 5= moderate/tolerable anxiety; 10= panic attacks) . Duration:  Mood episode-start at age 49 y/o . Timing: No specific timing . Context72- 22 y/o son died in 2011.    History of Present Illness: Suicidal Ideation: Negative Plan Formed: Negative Patient has means to carry out plan: Negative  Homicidal Ideation: Negative Plan Formed: Negative Patient has means to carry out plan: Negative    Neurologic: Headache: Negative Seizure: Negative Paresthesias: Negative  Past Medical Family, Social History:  Past Medical History  Diagnosis Date  . Depression   . Anxiety    Family  History  Problem Relation Age of Onset  . Diabetes Mother   . Anxiety disorder Mother   . Diabetes Father   . Schizophrenia Paternal Aunt   . Anxiety disorder Maternal Grandmother    History   Social History  . Marital Status: Married    Spouse Name: N/A  . Number of Children: N/A  . Years of Education: N/A   Social History Main Topics  . Smoking status: Never Smoker   . Smokeless tobacco: Never Used  . Alcohol Use: No  . Drug Use: No  . Sexual Activity:    Partners: Male   Other Topics Concern  . None   Social History Narrative    Outpatient Encounter Prescriptions as of 06/17/2014  Medication Sig  . citalopram (CELEXA) 40 MG tablet TAKE 1 TABLET (40 MG TOTAL) BY MOUTH DAILY. TAKE 1 TABLET DAILY  . CRYSELLE-28 0.3-30 MG-MCG tablet TAKE 1 TABLET BY MOUTH DAILY.  Marland Kitchen. lamoTRIgine (LAMICTAL) 150 MG tablet TAKE 1 TABLET (150 MG TOTAL) BY MOUTH 2 (TWO) TIMES DAILY.  Marland Kitchen. PROVENTIL HFA 108 (90 BASE) MCG/ACT inhaler Inhale 2 puffs into the lungs every 4 (four) hours as needed for wheezing or shortness of breath.  . QUEtiapine (SEROQUEL) 100 MG tablet Take at night.  . [DISCONTINUED] QUEtiapine (SEROQUEL) 100 MG tablet Take half tablet for first 2 days then one tablet a night. Caution about sedation effect.     Review of Systems  Constitutional: Negative.   Neurological: Negative for tremors.  Psychiatric/Behavioral: Negative for depression and suicidal ideas. The patient is  not nervous/anxious.      Physical Exam: Constitutional:  BP 131/80 mmHg  Pulse 80  Ht  (1.549 m)  Wt 170 lb (77.111 kg)  BMI 32.14 kg/m2  General Appearance: alert, oriented, no acute distress Musculoskeletal: Gait & Station: normal Patient leans: N/A  Psychiatric Speciality Exam: General Appearance: Casual and Well Groomed  Patent attorney::  Good  Speech:  Clear and Coherent and Normal Rate  Volume:  Normal  Mood:  Euthymic. Not hopeless  Affect:  constricted  Thought Process:  Coherent,  Linear and Logical  Orientation:  Full (Time, Place, and Person)  Thought Content:  WDL  Suicidal Thoughts:  No  Homicidal Thoughts:  No  Memory:  Immediate;   Good Recent;   Good Remote;   Good  Judgement:  Good  Insight:  Fair  Psychomotor Activity:  Normal  Concentration:  Good  Recall:  Good  Akathisia:  No  Handed:  Right  AIMS (if indicated):     Assets:  Intimacy Resilience Social Clinical biochemist of knowledge is good     Assessment: Axis I: Bipolar II Disorder- depressed    Plan:  Plan of Care:  PLAN:  1. Affirm with the patient that the medications are taken as ordered. Patient  expressed understanding of how their medications were to be used.    Laboratory: None   Psychotherapy: Therapy: brief supportive therapy provided.  Discussed psychosocial stressors in detail. More than 50% of the visit was spent on individual therapy/counseling.   Medications:  Continue  the following psychiatric medications as written prior to this appointment with the following changes: a) Lamictal 150 mg BID b) Citalopram 40 mg Lower seroquel  she can take half tablet.   Routine PRN Medications:  Negative  Consultations: The patient was encouraged to keep all PCP and specialty clinic appointments.   Safety Concerns:   Patient told to call clinic if any problems occur. Patient advised to go to  ER  if she should develop SI/HI, side effects, or if symptoms worsen. Has crisis numbers to call if needed.    Other:   8. Patient was instructed to return to clinic in 6 weeks.   9. The patient was advised to call and cancel their mental health appointment within 24 hours of the appointment, if they are unable to keep the appointment, as well as the three no show and termination from clinic policy. 10. The patient expressed understanding of the plan and agrees with the above. Not want to be in therapy, i did encourage that it would be helpful.  Husband or patient to  call if any symptoms worsen.     Thresa Ross, M.D.  06/17/2014 3:35 PM

## 2014-07-12 ENCOUNTER — Telehealth: Payer: Self-pay | Admitting: *Deleted

## 2014-07-12 MED ORDER — NORGESTREL-ETHINYL ESTRADIOL 0.3-30 MG-MCG PO TABS: 1.0000 | ORAL_TABLET | Freq: Every day | ORAL | Status: DC

## 2014-07-12 NOTE — Telephone Encounter (Signed)
Pt called and lvm asking for refill of her bc med. She stated that she has not taken this med in 6 mos and that Dr. Linford ArnoldMetheney gave this to her for heavy bleeding. She has an appt on 5/28 .Cynthia Barrett, Cynthia Smola McEwenLynetta

## 2014-08-10 ENCOUNTER — Other Ambulatory Visit (HOSPITAL_COMMUNITY): Payer: Self-pay | Admitting: *Deleted

## 2014-08-10 ENCOUNTER — Other Ambulatory Visit: Payer: Self-pay

## 2014-08-10 MED ORDER — PROVENTIL HFA 108 (90 BASE) MCG/ACT IN AERS: 2.0000 | INHALATION_SPRAY | RESPIRATORY_TRACT | Status: DC | PRN

## 2014-08-19 ENCOUNTER — Ambulatory Visit (INDEPENDENT_AMBULATORY_CARE_PROVIDER_SITE_OTHER): Payer: BLUE CROSS/BLUE SHIELD | Admitting: Psychiatry

## 2014-08-19 ENCOUNTER — Encounter (HOSPITAL_COMMUNITY): Payer: Self-pay | Admitting: Psychiatry

## 2014-08-19 ENCOUNTER — Encounter: Payer: Self-pay | Admitting: Family Medicine

## 2014-08-19 VITALS — BP 128/80 | HR 82 | Ht 61.0 in | Wt 170.0 lb

## 2014-08-19 DIAGNOSIS — F3181 Bipolar II disorder: Secondary | ICD-10-CM | POA: Diagnosis not present

## 2014-08-19 DIAGNOSIS — F411 Generalized anxiety disorder: Secondary | ICD-10-CM

## 2014-08-19 MED ORDER — QUETIAPINE FUMARATE 100 MG PO TABS: ORAL_TABLET | ORAL | Status: DC

## 2014-08-19 NOTE — Progress Notes (Signed)
Patient ID: Cynthia Barrett, female   DOB: [birth year], 49 y.o.   MRN: [id number]   [facility] Follow-up Outpatient Visit  Cynthia Barrett 1965/11/12 [id number] 49 y.o.  08/19/2014 10:14 AM  Chief Complaint:  HPI Comments: Cynthia Barrett is  a 49 y/o female with a past psychiatric history significant for Bipolar disorder. The patient is referred for psychiatric services for psychiatric evaluation and medication management.   Seroquel has helped her depression and sleep. She is feeling less labile. Her concern today is her daughter who is into heroine. Her daughters for daughters. Patient is taking care of one of her daughter was age 53. Other daughters are distributed among other family members while she goes in [place]. Patient's husband is somewhat upset because he feels its not his responsibility to take care of the young kid. She continues to work and less stress at work. She feels medications are keeping her more balanced  There is no involuntary movements noticeable. Patient cut down the Seroquel to half a tablet and she still sleeps reasonable now and is struggling less with the lower dose. She understands she is to work on weight loss and if needed get an evaluation for sleep study. . Severity: Depression: 7/10 (0=Very depressed; 5=Neutral; 10=Very Happy)  Anxiety- 2/10 (0=no anxiety; 5= moderate/tolerable anxiety; 10= panic attacks) . Duration:  Mood episode-start at age 69 y/o . Timing: No specific timing . Context- 32 y/o son died in Sep 19, 2009.    History of Present Illness: Suicidal Ideation: Negative Plan Formed: Negative Patient has means to carry out plan: Negative  Homicidal Ideation: Negative Plan Formed: Negative Patient has means to carry out plan: Negative    Neurologic: Headache: Negative Seizure: Negative Paresthesias: Negative  Past Medical Family, Social History:  Past Medical History  Diagnosis Date  . Depression   . Anxiety    Family History  Problem  Relation Age of Onset  . Diabetes Mother   . Anxiety disorder Mother   . Diabetes Father   . Schizophrenia Paternal Aunt   . Anxiety disorder Maternal Grandmother    History   Social History  . Marital Status: Married    Spouse Name: N/A  . Number of Children: N/A  . Years of Education: N/A   Social History Main Topics  . Smoking status: Never Smoker   . Smokeless tobacco: Never Used  . Alcohol Use: No  . Drug Use: No  . Sexual Activity:    Partners: Male   Other Topics Concern  . None   Social History Narrative    Outpatient Encounter Prescriptions as of 08/19/2014  Medication Sig  . citalopram (CELEXA) 40 MG tablet TAKE 1 TABLET (40 MG TOTAL) BY MOUTH DAILY. TAKE 1 TABLET DAILY  . lamoTRIgine (LAMICTAL) 150 MG tablet TAKE 1 TABLET (150 MG TOTAL) BY MOUTH 2 (TWO) TIMES DAILY.  . norgestrel-ethinyl estradiol (CRYSELLE-28) 0.3-30 MG-MCG tablet Take 1 tablet by mouth daily.  Marland Kitchen PROVENTIL HFA 108 (90 BASE) MCG/ACT inhaler Inhale 2 puffs into the lungs every 4 (four) hours as needed for wheezing or shortness of breath.  . QUEtiapine (SEROQUEL) 100 MG tablet Take at night.  . [DISCONTINUED] QUEtiapine (SEROQUEL) 100 MG tablet Take at night.     Review of Systems  Constitutional: Negative for fever.  Cardiovascular: Negative for chest pain and palpitations.  Skin: Negative for rash.  Neurological: Negative for tremors.  Psychiatric/Behavioral: Negative for depression and substance abuse.     Physical Exam: Constitutional:  BP 128/80  mmHg  Pulse 82  Ht 5\' 1"  (1.549 m)  Wt 170 lb (77.111 kg)  BMI 32.14 kg/m2  General Appearance: alert, oriented, no acute distress Musculoskeletal: Gait & Station: normal Patient leans: N/A  Psychiatric Speciality Exam: General Appearance: Casual and Well Groomed  Patent attorneyye Contact::  Good  Speech:  Clear and Coherent and Normal Rate  Volume:  Normal  Mood:  Euthymic. Not hopeless  Affect:  constricted  Thought Process:  Coherent,  Linear and Logical  Orientation:  Full (Time, Place, and Person)  Thought Content:  WDL  Suicidal Thoughts:  No  Homicidal Thoughts:  No  Memory:  Immediate;   Good Recent;   Good Remote;   Good  Judgement:  Good  Insight:  Fair  Psychomotor Activity:  Normal  Concentration:  Good  Recall:  Good  Akathisia:  No  Handed:  Right  AIMS (if indicated):     Assets:  Intimacy Resilience Social Clinical biochemistupport  Language-intact  Fund of knowledge is good     Assessment: Axis I: Bipolar II Disorder- depressed    Plan:  Plan of Care:  PLAN:  1. Affirm with the patient that the medications are taken as ordered. Patient  expressed understanding of how their medications were to be used.    Laboratory: None   Psychotherapy: Therapy: brief supportive therapy provided.  Discussed psychosocial stressors in detail. More than 50% of the visit was spent on individual therapy/counseling.   Medications:  Continue  the following psychiatric medications as written prior to this appointment with the following changes: a) Lamictal 150 mg BID b) Citalopram 40 mg Refill seroquel 100mg  qhs or at times take half.  She has other refills can call if needed.  Routine PRN Medications:  Negative  Consultations: The patient was encouraged to keep all PCP and specialty clinic appointments.   Safety Concerns:   Patient told to call clinic if any problems occur. Patient advised to go to  ER  if she should develop SI/HI, side effects, or if symptoms worsen. Has crisis numbers to call if needed.    Other:   8. Patient was instructed to return to clinic in 8 weeks.   9. The patient was advised to call and cancel their mental health appointment within 24 hours of the appointment, if they are unable to keep the appointment, as well as the three no show and termination from clinic policy. 10. The patient expressed understanding of the plan and agrees with the above. Not want to be in therapy, i did encourage that it  would be helpful.  Husband or patient to call if any symptoms worsen.     Thresa RossNADEEM Affan Callow, M.D.  08/19/2014 10:14 AM

## 2014-08-30 ENCOUNTER — Other Ambulatory Visit (HOSPITAL_COMMUNITY): Payer: Self-pay | Admitting: Psychiatry

## 2014-08-30 NOTE — Telephone Encounter (Signed)
Received fax from pharmacy for a refill for Lamictal 150mg . Per Dr. Gilmore LarocheAkhtar, pt is authorized for a refill for Lamictal 150mg , Qty 180. Prescription was sent to pharmacy. Pt has f/u appt on 6/28. Called and informed pt of prescription status. Pt states and shows understanding.

## 2014-10-19 ENCOUNTER — Ambulatory Visit (HOSPITAL_COMMUNITY): Payer: Self-pay | Admitting: Psychiatry

## 2014-11-13 ENCOUNTER — Other Ambulatory Visit (HOSPITAL_COMMUNITY): Payer: Self-pay | Admitting: Psychiatry

## 2014-11-17 NOTE — Telephone Encounter (Signed)
PT will need to contact office for an appt. 

## 2014-11-22 ENCOUNTER — Encounter: Payer: Self-pay | Admitting: Family Medicine

## 2014-11-22 ENCOUNTER — Ambulatory Visit (INDEPENDENT_AMBULATORY_CARE_PROVIDER_SITE_OTHER): Payer: BLUE CROSS/BLUE SHIELD | Admitting: Family Medicine

## 2014-11-22 VITALS — BP 131/87 | HR 88 | Ht 61.0 in | Wt 166.0 lb

## 2014-11-22 DIAGNOSIS — H5713 Ocular pain, bilateral: Secondary | ICD-10-CM

## 2014-11-22 DIAGNOSIS — H5710 Ocular pain, unspecified eye: Secondary | ICD-10-CM | POA: Insufficient documentation

## 2014-11-22 NOTE — Assessment & Plan Note (Signed)
Worsening eye pain with photophobia. This is possibly as simple as allergic conjunctivitis however I'm concerned for possible iritis. Refer to ophthalmology. She has an appointment scheduled tomorrow with Dr Hardie Shackleton. F/u PRN.

## 2014-11-22 NOTE — Patient Instructions (Signed)
Thank you for coming in today. Follow up tomorrow at 3:50pm tomorrow with Hageman.  Address: [address], [place], [place] [address] Phone: [phone]  Use over-the-counter Zaditor eyedrops (Ketotifen) Use over-the-counter Zyrtec (cetirizine)  Use Systane artificial tears as needed  Iritis Iritis is an inflammation of the colored part of the eye (iris). Other parts at the front of the eye may also be inflamed. The iris is part of the middle layer of the eyeball which is called the uvea or the uveal track. Any part of the uveal track can become inflamed. The other portions of the uveal track are the choroid (the thin membrane under the outer layer of the eye), and the ciliary body (joins the choroid and the iris and produces the fluid in the front of the eye).  It is extremely important to treat iritis early, as it may lead to internal eye damage causing scarring or diseases such as glaucoma. Some people have only one attack of iritis (in one or both eyes) in their lifetime, while others may get it many times. CAUSES Iritis can be associated with many different diseases, but mostly occurs in otherwise healthy people. Examples of diseases that can be associated with iritis include:  Diseases where the body's immune system attacks tissues within your own body (autoimmune diseases).  Infections (tuberculosis, gonorrhea, fungus infections, Lyme disease, infection of the lining of the heart).  Trauma or injury.  Eye diseases (acute glaucoma and others).  Inflammation from other parts of the uveal track.  Severe eye infections.  Other rare diseases. SYMPTOMS  Eye pain or aching.  Sensitivity to light.  Loss of sight or blurred vision.  Redness of the eye. This is often accompanied by a ring of redness around the outside of the cornea, or clear covering at the front of the eye (ciliary flush).  Excessive tearing of the eye(s).  A small pupil that does not enlarge in the dark  and stays smaller than the other eye's pupil.  A whitish area that obscures the lower part of the colored circular iris. Sometimes this is visible when looking at the eye, where the whitish area has a "fluid level" or flat top. This is called a "hypopyon" and is actually pus inside the eye. Since iritis causes the eye to become red, it is often confused with a much less dangerous form of "pink eye" or conjunctivitis. One of the most important symptoms is sensitivity to light. Anytime there is redness, discomfort in the eye(s) and extreme light sensitivity, it is extremely important to see an ophthalmologist as soon as possible. TREATMENT Acute iritis requires prompt medical evaluation by an eye specialist (ophthalmologist.) Treatment depends on the underlying cause but may include:  Corticosteroid eye drops and dilating eye drops. Follow your caregiver's exact instructions on taking and stopping corticosteroid medications (drops or pills).  Occasionally, the iritis will be so severe that it will not respond to commonly used medications. If this happens, it may be necessary to use steroid injections. The injections are given under the eye's outer surface. Sometimes oral medications are given. The decision on treatment used for iritis is usually made on an individual basis. HOME CARE INSTRUCTIONS Your care giver will give specific instructions regarding the use of eye medications or other medications. Be certain to follow all instructions in both taking and stopping the medications. SEEK IMMEDIATE MEDICAL CARE IF:  You have redness of one or both eye.  You experience a great deal of light  sensitivity.  You have pain or aching in either eye. MAKE SURE YOU:   Understand these instructions.  Will watch your condition.  Will get help right away if you are not doing well or get worse. Document Released: 04/09/2005 Document Revised: 07/02/2011 Document Reviewed: 09/27/2006 [facility] Patient  Information 2015 [place], [place]. This information is not intended to replace advice given to you by your health care provider. Make sure you discuss any questions you have with your health care provider.

## 2014-11-22 NOTE — Progress Notes (Signed)
Cynthia Barrett is a 49 y.o. female who presents to [facility]  today for eye pain and irritation. Patient has a three-week history of eye pain and irritation bilaterally left worse than right. She notes worsening blurry vision and pain with light exposure worsen last several days. She notes initial some mild discharge which has since resolved. She denies any fevers chills nausea vomiting or diarrhea. She has used some lubricating eyedrops which helps some.    Past Medical History  Diagnosis Date  . Depression   . Anxiety    Past Surgical History  Procedure Laterality Date  . Tubal ligation  2004  . Tummy tuck  10/2004   History  Substance Use Topics  . Smoking status: Never Smoker   . Smokeless tobacco: Never Used  . Alcohol Use: No   ROS as above Medications: Current Outpatient Prescriptions  Medication Sig Dispense Refill  . citalopram (CELEXA) 40 MG tablet TAKE 1 TABLET (40 MG TOTAL) BY MOUTH DAILY. TAKE 1 TABLET DAILY 90 tablet 0  . lamoTRIgine (LAMICTAL) 150 MG tablet TAKE 1 TABLET (150 MG TOTAL) BY MOUTH 2 (TWO) TIMES DAILY. 180 tablet 0  . norgestrel-ethinyl estradiol (CRYSELLE-28) 0.3-30 MG-MCG tablet Take 1 tablet by mouth daily. 28 tablet 6  . PROVENTIL HFA 108 (90 BASE) MCG/ACT inhaler Inhale 2 puffs into the lungs every 4 (four) hours as needed for wheezing or shortness of breath. 1 Inhaler 1  . QUEtiapine (SEROQUEL) 100 MG tablet Take at night. 30 tablet 1   No current facility-administered medications for this visit.   No Known Allergies   Exam:  BP 131/87 mmHg  Pulse 88  Ht  (1.549 m)  Wt 166 lb (75.297 kg)  BMI 31.38 kg/m2 Gen: Well NAD HEENT: EOMI,  MMM eyes normal appearing bilaterally without any significant conjunctival injection or discharge. She has mild photophobia with ophthalmic exam. PERRL bilaterally. Pain-free eye motion. The fundus is visible bilaterally. Lungs: Normal work of breathing. CTABL Heart: RRR  no MRG Abd: NABS, Soft. Nondistended, Nontender Exts: Brisk capillary refill, warm and well perfused.  Visual Acuity: 20/30 bilaterally right 20/40 left  No results found for this or any previous visit (from the past 24 hour(s)). No results found.   Please see individual assessment and plan sections.

## 2014-12-04 ENCOUNTER — Other Ambulatory Visit (HOSPITAL_COMMUNITY): Payer: Self-pay | Admitting: Psychiatry

## 2014-12-05 ENCOUNTER — Other Ambulatory Visit (HOSPITAL_COMMUNITY): Payer: Self-pay | Admitting: Psychiatry

## 2014-12-06 NOTE — Telephone Encounter (Signed)
Received medication refill request from [facility] for Celexa. Per Dr. Gilmore Laroche, pt will need to schedule an appt with the office.

## 2014-12-06 NOTE — Telephone Encounter (Signed)
Received medication refill request from [facility] for Lamictal. Per Dr. Gilmore Laroche, pt will need to schedule an appt with the office.

## 2014-12-07 ENCOUNTER — Ambulatory Visit (INDEPENDENT_AMBULATORY_CARE_PROVIDER_SITE_OTHER): Payer: BLUE CROSS/BLUE SHIELD | Admitting: Psychiatry

## 2014-12-07 ENCOUNTER — Encounter (HOSPITAL_COMMUNITY): Payer: Self-pay | Admitting: Psychiatry

## 2014-12-07 VITALS — BP 122/70 | HR 79 | Ht 61.0 in | Wt 164.0 lb

## 2014-12-07 DIAGNOSIS — F411 Generalized anxiety disorder: Secondary | ICD-10-CM

## 2014-12-07 DIAGNOSIS — Z639 Problem related to primary support group, unspecified: Secondary | ICD-10-CM

## 2014-12-07 DIAGNOSIS — F3181 Bipolar II disorder: Secondary | ICD-10-CM | POA: Diagnosis not present

## 2014-12-07 MED ORDER — CITALOPRAM HYDROBROMIDE 40 MG PO TABS: ORAL_TABLET | ORAL | Status: DC

## 2014-12-07 NOTE — Progress Notes (Signed)
Patient ID: Cynthia Barrett, female   DOB: July 01, 1965, 49 y.o.   MRN: [id number]   [facility] Follow-up Outpatient Visit  Cynthia Barrett [birth year] [id number] 49 y.o.  12/07/2014 4:14 PM  Chief Complaint:  HPI Comments: Cynthia Barrett is  a 49 y/o female with a past psychiatric history significant for Bipolar disorder. The patient is referred for psychiatric services for psychiatric evaluation and medication management.   Has been noncompliant dyslexia. Has not been taken for the last 2 or 3 weeks is that she needs a refill. She has been feeling stressed out and anxious. She gets overwhelmed because she has to take care of her daughter's daughter. Difficult relationship her husband does not want the stepdaughter to be at home for long. Apparently she is also not been taking cervical she endorsed a today says that it was causing her to store and her husband does not like it. Mood wise somewhat down  and overwhelmed and anxious at times History of being noncompliant with her medications and mood instability. So we talked mostly about compliance says that she wants to get back on Celexa because it was helping along with the Lamictal She continues to work and less stress at work.    There is no involuntary movements noticeable. Patient cut down the Seroquel to half a tablet and she still sleeps reasonable now and is struggling less with the lower dose. She understands she is to work on weight loss and if needed get an evaluation for sleep study. . Severity: Depression: 6/10 (0=Very depressed; 5=Neutral; 10=Very Happy)  Anxiety- 4/10 (0=no anxiety; 5= moderate/tolerable anxiety; 10= panic attacks) . Duration:  Mood episode-start at age 26 y/o . Timing: No specific timing . Context- 3 y/o son died in 09/20/09.    History of Present Illness: Suicidal Ideation: Negative Plan Formed: Negative Patient has means to carry out plan: Negative  Homicidal Ideation: Negative Plan Formed: Negative  Patient has means to carry out plan: Negative    Neurologic: Headache: Negative Seizure: Negative Paresthesias: Negative  Past Medical Family, Social History:  Past Medical History  Diagnosis Date  . Depression   . Anxiety    Family History  Problem Relation Age of Onset  . Diabetes Mother   . Anxiety disorder Mother   . Diabetes Father   . Schizophrenia Paternal Aunt   . Anxiety disorder Maternal Grandmother    Social History   Social History  . Marital Status: Married    Spouse Name: N/A  . Number of Children: N/A  . Years of Education: N/A   Social History Main Topics  . Smoking status: Never Smoker   . Smokeless tobacco: Never Used  . Alcohol Use: No  . Drug Use: No  . Sexual Activity:    Partners: Male   Other Topics Concern  . None   Social History Narrative    Outpatient Encounter Prescriptions as of 12/07/2014  Medication Sig  . citalopram (CELEXA) 40 MG tablet TAKE 1 TABLET (40 MG TOTAL) BY MOUTH DAILY. TAKE 1 TABLET DAILY  . lamoTRIgine (LAMICTAL) 150 MG tablet TAKE 1 TABLET (150 MG TOTAL) BY MOUTH 2 (TWO) TIMES DAILY.  . norgestrel-ethinyl estradiol (CRYSELLE-28) 0.3-30 MG-MCG tablet Take 1 tablet by mouth daily.  Marland Kitchen PROVENTIL HFA 108 (90 BASE) MCG/ACT inhaler Inhale 2 puffs into the lungs every 4 (four) hours as needed for wheezing or shortness of breath.  . [DISCONTINUED] citalopram (CELEXA) 40 MG tablet TAKE 1 TABLET (40 MG TOTAL) BY MOUTH  DAILY. TAKE 1 TABLET DAILY  . [DISCONTINUED] QUEtiapine (SEROQUEL) 100 MG tablet Take at night.   No facility-administered encounter medications on file as of 12/07/2014.     Review of Systems  Constitutional: Negative for fever.  Cardiovascular: Negative for chest pain.  Skin: Negative for rash.  Neurological: Negative for tingling and tremors.  Psychiatric/Behavioral: Negative for depression and substance abuse.     Physical Exam: Constitutional:  BP 122/70 mmHg  Pulse 79  Ht  (1.549 m)  Wt  164 lb (74.39 kg)  BMI 31.00 kg/m2  SpO2 97%  LMP 11/15/2014  General Appearance: alert, oriented, no acute distress Musculoskeletal: Gait & Station: normal Patient leans: N/A  Psychiatric Speciality Exam: General Appearance: Casual and Well Groomed  Patent attorney::  Good  Speech:  Clear and Coherent and Normal Rate  Volume:  Normal  Mood: episodically dysphoric   Affect:  constricted  Thought Process:  Coherent, Linear and Logical  Orientation:  Full (Time, Place, and Person)  Thought Content:  WDL  Suicidal Thoughts:  No  Homicidal Thoughts:  No  Memory:  Immediate;   Good Recent;   Good Remote;   Good  Judgement:  Good  Insight:  Fair  Psychomotor Activity:  Normal  Concentration:  Good  Recall:  Good  Akathisia:  No  Handed:  Right  AIMS (if indicated):     Assets:  Intimacy Resilience Social Support  Language-intact  Fund of knowledge is good     Assessment: Axis I: Bipolar II Disorder- depressed. GAD. Relationship concerns    Plan:  Plan of Care:  PLAN:  1. Affirm with the patient that the medications are taken as ordered. Patient  expressed understanding of how their medications were to be used.    Laboratory: None   Psychotherapy: Therapy: brief supportive therapy provided.  Discussed psychosocial stressors in detail. More than 50% of the visit was spent on individual therapy/counseling.   Medications:  Continue  the following psychiatric medications as written prior to this appointment with the following changes: a) Lamictal 150 mg BID.she has meds b) Citalopram 40 mg. Re start prescripiton given DC seroquel. Not taking She will work on her relationship. Says her daughter if she gets better will be able to take granddaughter back.   Routine PRN Medications:  Negative  Consultations: The patient was encouraged to keep all PCP and specialty clinic appointments.   Safety Concerns:   Patient told to call clinic if any problems occur. Patient advised  to go to  ER  if she should develop SI/HI, side effects, or if symptoms worsen. Has crisis numbers to call if needed.    Other:   8. Patient was instructed to return to clinic in 8 weeks.   9. The patient was advised to call and cancel their mental health appointment within 24 hours of the appointment, if they are unable to keep the appointment, as well as the three no show and termination from clinic policy. 10. The patient expressed understanding of the plan and agrees with the above. Not want to be in therapy, i did encourage that it would be helpful.  Husband or patient to call if any symptoms worsen.     Thresa Ross, M.D.  12/07/2014 4:14 PM

## 2014-12-16 ENCOUNTER — Other Ambulatory Visit (HOSPITAL_COMMUNITY): Payer: Self-pay | Admitting: Psychiatry

## 2014-12-20 NOTE — Telephone Encounter (Signed)
Received [facility] from [facility] for a refill for Lamictal . Per Dr. Gilmore Laroche, pt is authorized for a refill for Lamictal 150, #180. Pt has a f/u appt on 02/03/15. Prescription was sent to pharmacy. Called and informed pt of prescription status. Pt verbalizes understanding.

## 2015-01-11 ENCOUNTER — Ambulatory Visit (HOSPITAL_COMMUNITY): Payer: Self-pay | Admitting: Psychiatry

## 2015-01-18 ENCOUNTER — Telehealth (HOSPITAL_COMMUNITY): Payer: Self-pay

## 2015-01-20 NOTE — Telephone Encounter (Signed)
Recommend for therapy or counselling

## 2015-02-03 ENCOUNTER — Ambulatory Visit (INDEPENDENT_AMBULATORY_CARE_PROVIDER_SITE_OTHER): Payer: BLUE CROSS/BLUE SHIELD | Admitting: Psychiatry

## 2015-02-03 ENCOUNTER — Encounter (HOSPITAL_COMMUNITY): Payer: Self-pay | Admitting: Psychiatry

## 2015-02-03 VITALS — BP 122/70 | HR 87 | Ht 61.0 in | Wt 163.0 lb

## 2015-02-03 DIAGNOSIS — F3181 Bipolar II disorder: Secondary | ICD-10-CM | POA: Diagnosis not present

## 2015-02-03 DIAGNOSIS — Z639 Problem related to primary support group, unspecified: Secondary | ICD-10-CM

## 2015-02-03 DIAGNOSIS — F938 Other childhood emotional disorders: Secondary | ICD-10-CM | POA: Diagnosis not present

## 2015-02-03 DIAGNOSIS — F411 Generalized anxiety disorder: Secondary | ICD-10-CM

## 2015-02-03 NOTE — Progress Notes (Signed)
Patient ID: Cynthia Barrett, female   DOB: 07/16/1965, 49 y.o.   MRN: [id number]   [facility]  Cynthia Barrett 01/14/1966 [id number] 49 y.o.  02/03/2015 4:10 PM  Chief Complaint:  HPI Comments: Cynthia Barrett is  a 49 y/o female with a past psychiatric history significant for Bipolar disorder. The patient is referred for psychiatric services for psychiatric evaluation and medication management.   [place]. She strengthened to deal with the loss and talk more with her sister. Patient is a daughter who has had concerns or drug use and she wanted her to get appointment. Daughter also had MERCA and is recovering.  Patient otherwise tolerating medications taking it regularly. History of non compliance in past which was discussed last visit. Mood wise somewhat down related to her mom death .   She continues to work but  stress at work.  No rash or side effects on meds.    . Severity: Depression: 6/10 (0=Very depressed; 5=Neutral; 10=Very Happy)  Anxiety- 4/10 (0=no anxiety; 5= moderate/tolerable anxiety; 10= panic attacks) Aggravating factor: her mom death. Family issues and her daghter Modifying factor: husband at times supportive. job . Duration:  Mood episode-start at age 49 y/o . Timing: No specific timing . Context24- 22 y/o son died in 2011.    History of Present Illness: Suicidal Ideation: Negative Plan Formed: Negative Patient has means to carry out plan: Negative  Homicidal Ideation: Negative Plan Formed: Negative Patient has means to carry out plan: Negative    Neurologic: Headache: Negative Seizure: Negative Paresthesias: Negative  Past Medical Family, Social History:  Past Medical History  Diagnosis Date  . Depression   . Anxiety    Family History  Problem Relation Age of Onset  . Diabetes Mother   . Anxiety disorder Mother   . Diabetes Father   . Schizophrenia Paternal Aunt   . Anxiety  disorder Maternal Grandmother    Social History   Social History  . Marital Status: Married    Spouse Name: N/A  . Number of Children: N/A  . Years of Education: N/A   Social History Main Topics  . Smoking status: Never Smoker   . Smokeless tobacco: Never Used  . Alcohol Use: No  . Drug Use: No  . Sexual Activity:    Partners: Male   Other Topics Concern  . None   Social History Narrative    Outpatient Encounter Prescriptions as of 02/03/2015  Medication Sig  . citalopram (CELEXA) 40 MG tablet TAKE 1 TABLET (40 MG TOTAL) BY MOUTH DAILY. TAKE 1 TABLET DAILY  . lamoTRIgine (LAMICTAL) 150 MG tablet TAKE 1 TABLET (150 MG TOTAL) BY MOUTH 2 (TWO) TIMES DAILY.  . norgestrel-ethinyl estradiol (CRYSELLE-28) 0.3-30 MG-MCG tablet Take 1 tablet by mouth daily.  Marland Kitchen. PROVENTIL HFA 108 (90 BASE) MCG/ACT inhaler Inhale 2 puffs into the lungs every 4 (four) hours as needed for wheezing or shortness of breath.   No facility-administered encounter medications on file as of 02/03/2015.     Review of Systems  Constitutional: Negative for fever.  Cardiovascular: Negative for chest pain and palpitations.  Skin: Negative for rash.  Neurological: Negative for tingling and tremors.  Psychiatric/Behavioral: Negative for depression and substance abuse. The patient does not have insomnia.      Physical Exam: Constitutional:  BP 122/70 mmHg  Pulse 87  Ht 5\' 1"  (1.549 m)  Wt 163 lb (73.936 kg)  BMI 30.81 kg/m2  SpO2 97%  General Appearance: alert, oriented, no acute distress Musculoskeletal: Gait & Station: normal Patient leans: N/A  Psychiatric Speciality Exam: General Appearance: Casual and Well Groomed  Patent attorney::  Good  Speech:  Clear and Coherent and Normal Rate  Volume:  Normal  Mood: episodically dysphoric   Affect:  constricted  Thought Process:  Coherent, Linear and Logical  Orientation:  Full (Time, Place, and Person)  Thought Content:  WDL  Suicidal Thoughts:  No   Homicidal Thoughts:  No  Memory:  Immediate;   Good Recent;   Good Remote;   Good  Judgement:  Good  Insight:  Fair  Psychomotor Activity:  Normal  Concentration:  Good  Recall:  Good  Akathisia:  No  Handed:  Right  AIMS (if indicated):     Assets:  Intimacy Resilience Social Support  Language-intact  Fund of knowledge is good     Assessment: Axis I: Bipolar II Disorder- depressed. GAD. Relationship concerns    Plan:  Plan of Care:  PLAN:  1. Affirm with the patient that the medications are taken as ordered. Patient  expressed understanding of how their medications were to be used.    Laboratory: None   Psychotherapy: Therapy: brief supportive therapy provided.  Discussed psychosocial stressors in detail. More than 50% of the visit was spent on individual therapy/counseling. . Provided supportive therapy in regard to her grief.   Medications:  Continue  the following psychiatric medications as written prior to this appointment with the following changes: a) Lamictal 150 mg BID.she has meds b) Citalopram 40 mg. She has meds.   She will work on her relationship. Says her daughter if she gets better will be able to take granddaughter back.   Routine PRN Medications:  Negative  Consultations: The patient was encouraged to keep all PCP and specialty clinic appointments.   Safety Concerns:   Patient told to call clinic if any problems occur. Patient advised to go to  ER  if she should develop SI/HI, side effects, or if symptoms worsen. Has crisis numbers to call if needed.    Other:   8. Patient was instructed to return to clinic in 8 weeks.   9. The patient was advised to call and cancel their mental health appointment within 24 hours of the appointment, if they are unable to keep the appointment, as well as the three no show and termination from clinic policy. 10. The patient expressed understanding of the plan and agrees with the above.  Time spent: 25 minutes    Thresa Ross, M.D.  02/03/2015 4:10 PM

## 2015-03-04 ENCOUNTER — Other Ambulatory Visit (HOSPITAL_COMMUNITY): Payer: Self-pay | Admitting: Psychiatry

## 2015-03-04 NOTE — Telephone Encounter (Signed)
*  Please see previous note*   Pt received a prescription for Celexa 40mg ,#90 on 12/07/14 for a 90 day supply. Called and informed pt of prescription status. Pt verbalizes understanding.

## 2015-03-04 NOTE — Telephone Encounter (Signed)
Received medication from [facility] for Celexa 40mg . Per Dr. Gilmore LarocheAkhtar, medication request is denied. [facility]

## 2015-04-05 ENCOUNTER — Encounter: Payer: Self-pay | Admitting: Family Medicine

## 2015-04-05 ENCOUNTER — Other Ambulatory Visit: Payer: Self-pay | Admitting: Family Medicine

## 2015-04-05 DIAGNOSIS — Z1329 Encounter for screening for other suspected endocrine disorder: Secondary | ICD-10-CM

## 2015-04-05 DIAGNOSIS — Z111 Encounter for screening for respiratory tuberculosis: Secondary | ICD-10-CM

## 2015-04-05 DIAGNOSIS — Z13220 Encounter for screening for lipoid disorders: Secondary | ICD-10-CM

## 2015-04-07 ENCOUNTER — Encounter (HOSPITAL_COMMUNITY): Payer: Self-pay | Admitting: Psychiatry

## 2015-04-07 ENCOUNTER — Ambulatory Visit (INDEPENDENT_AMBULATORY_CARE_PROVIDER_SITE_OTHER): Payer: BLUE CROSS/BLUE SHIELD | Admitting: Psychiatry

## 2015-04-07 VITALS — BP 118/84 | HR 77 | Wt 164.0 lb

## 2015-04-07 DIAGNOSIS — F411 Generalized anxiety disorder: Secondary | ICD-10-CM

## 2015-04-07 DIAGNOSIS — Z639 Problem related to primary support group, unspecified: Secondary | ICD-10-CM

## 2015-04-07 DIAGNOSIS — F3181 Bipolar II disorder: Secondary | ICD-10-CM

## 2015-04-07 DIAGNOSIS — F938 Other childhood emotional disorders: Secondary | ICD-10-CM | POA: Diagnosis not present

## 2015-04-07 MED ORDER — CITALOPRAM HYDROBROMIDE 40 MG PO TABS: ORAL_TABLET | ORAL | Status: DC

## 2015-04-07 MED ORDER — LAMOTRIGINE 150 MG PO TABS: ORAL_TABLET | ORAL | Status: DC

## 2015-04-07 NOTE — Progress Notes (Signed)
Patient ID: Cynthia Barrett, female   DOB: 05-25-1965, 49 y.o.   MRN: [id number]   [facility] Follow-up Outpatient Visit  Cynthia Barrett 26-Aug-1965 [id number] 49 y.o.  04/07/2015 4:11 PM  Chief Complaint:  HPI Comments: Cynthia Barrett is  a 49 y/o female with a past psychiatric history significant for Bipolar disorder. The patient is referred for psychiatric services for psychiatric evaluation and medication management.   Patient lost her mom September she was at [place]. She still has some variable days of dysphoria when she remembers her mom.   Daughter also had MERCA and is recovering. Patient has to take care of the grandkids at times and that produces conflict between her and the husband Patient otherwise tolerating medications taking it regularly. History of non compliance in past which was discussed last visit. Mood wise somewhat down related to her mom death .   She continues to work but  stress at work.  No rash or side effects on meds.    . Severity: Depression: 6/10 (0=Very depressed; 5=Neutral; 10=Very Happy)  Anxiety- 4/10 (0=no anxiety; 5= moderate/tolerable anxiety; 10= panic attacks) Aggravating factor: her mom death. Family issues and her daghter Modifying factor: husband at times supportive. job . Duration:  Mood episode-start at age 74 y/o . Timing: No specific timing . Context- 28 y/o son died in 16-Sep-2009.    History of Present Illness: Suicidal Ideation: Negative Plan Formed: Negative Patient has means to carry out plan: Negative  Homicidal Ideation: Negative Plan Formed: Negative Patient has means to carry out plan: Negative    Neurologic: Headache: Negative Seizure: Negative Paresthesias: Negative  Past Medical Family, Social History:  Past Medical History  Diagnosis Date  . Depression   . Anxiety    Family History  Problem Relation Age of Onset  . Diabetes Mother   . Anxiety disorder Mother   . Diabetes Father   . Schizophrenia Paternal  Aunt   . Anxiety disorder Maternal Grandmother    Social History   Social History  . Marital Status: Married    Spouse Name: N/A  . Number of Children: N/A  . Years of Education: N/A   Social History Main Topics  . Smoking status: Never Smoker   . Smokeless tobacco: Never Used  . Alcohol Use: No  . Drug Use: No  . Sexual Activity:    Partners: Male   Other Topics Concern  . None   Social History Narrative    Outpatient Encounter Prescriptions as of 04/07/2015  Medication Sig  . citalopram (CELEXA) 40 MG tablet TAKE 1 TABLET (40 MG TOTAL) BY MOUTH DAILY. TAKE 1 TABLET DAILY  . lamoTRIgine (LAMICTAL) 150 MG tablet TAKE 1 TABLET (150 MG TOTAL) BY MOUTH 2 (TWO) TIMES DAILY.  . norgestrel-ethinyl estradiol (CRYSELLE-28) 0.3-30 MG-MCG tablet Take 1 tablet by mouth daily.  Marland Kitchen PROVENTIL HFA 108 (90 BASE) MCG/ACT inhaler Inhale 2 puffs into the lungs every 4 (four) hours as needed for wheezing or shortness of breath.  . [DISCONTINUED] citalopram (CELEXA) 40 MG tablet TAKE 1 TABLET (40 MG TOTAL) BY MOUTH DAILY. TAKE 1 TABLET DAILY  . [DISCONTINUED] lamoTRIgine (LAMICTAL) 150 MG tablet TAKE 1 TABLET (150 MG TOTAL) BY MOUTH 2 (TWO) TIMES DAILY.   No facility-administered encounter medications on file as of 04/07/2015.     Review of Systems  Constitutional: Negative for fever.  Cardiovascular: Negative for chest pain and palpitations.  Skin: Negative for rash.  Neurological: Negative for tingling and tremors.  Psychiatric/Behavioral:  Negative for depression and substance abuse. The patient does not have insomnia.      Physical Exam: Constitutional:  BP 118/84 mmHg  Pulse 77  Wt 164 lb (74.39 kg)  General Appearance: alert, oriented, no acute distress Musculoskeletal: Gait & Station: normal Patient leans: N/A  Psychiatric Speciality Exam: General Appearance: Casual and Well Groomed  Patent attorneyye Contact::  Good  Speech:  Clear and Coherent and Normal Rate  Volume:  Normal   Mood: episodically dysphoric   Affect:  constricted  Thought Process:  Coherent, Linear and Logical  Orientation:  Full (Time, Place, and Person)  Thought Content:  WDL  Suicidal Thoughts:  No  Homicidal Thoughts:  No  Memory:  Immediate;   Good Recent;   Good Remote;   Good  Judgement:  Good  Insight:  Fair  Psychomotor Activity:  Normal  Concentration:  Good  Recall:  Good  Akathisia:  No  Handed:  Right  AIMS (if indicated):     Assets:  Intimacy Resilience Social Support  Language-intact  Fund of knowledge is good     Assessment: Axis I: Bipolar II Disorder- depressed. GAD. Relationship concerns    Plan:  Plan of Care:  PLAN:  1. Affirm with the patient that the medications are taken as ordered. Patient  expressed understanding of how their medications were to be used.    Laboratory: None   Psychotherapy: Therapy: brief supportive therapy provided.  Discussed psychosocial stressors in detail. More than 50% of the visit was spent on individual therapy/counseling. . Provided supportive therapy in regard to her grief.   Medications:  Continue  the following psychiatric medications as written prior to this appointment with the following changes: a) Lamictal 150 mg BID. b) Citalopram 40 mg.   She will work on her relationship. Brief grief counselling done   Routine PRN Medications:  Negative  Consultations: The patient was encouraged to keep all PCP and specialty clinic appointments.   Safety Concerns:   Patient told to call clinic if any problems occur. Patient advised to go to  ER  if she should develop SI/HI, side effects, or if symptoms worsen. Has crisis numbers to call if needed.    Other:   8. Patient was instructed to return to clinic in 8 weeks.   9. The patient was advised to call and cancel their mental health appointment within 24 hours of the appointment, if they are unable to keep the appointment, as well as the three no show and termination from  clinic policy. 10. The patient expressed understanding of the plan and agrees with the above.  Time spent: 25 minutes   Cynthia Barrett, M.D.  04/07/2015 4:11 PM

## 2015-04-11 ENCOUNTER — Encounter: Payer: Self-pay | Admitting: Family Medicine

## 2015-04-11 ENCOUNTER — Ambulatory Visit (INDEPENDENT_AMBULATORY_CARE_PROVIDER_SITE_OTHER): Payer: BLUE CROSS/BLUE SHIELD | Admitting: Family Medicine

## 2015-04-11 ENCOUNTER — Other Ambulatory Visit (HOSPITAL_COMMUNITY)
Admission: RE | Admit: 2015-04-11 | Discharge: 2015-04-11 | Disposition: A | Discharge status: Home / Self Care | Payer: BLUE CROSS/BLUE SHIELD | Source: Ambulatory Visit | Attending: Family Medicine | Admitting: Family Medicine

## 2015-04-11 VITALS — BP 133/59 | HR 84 | Wt 168.0 lb

## 2015-04-11 DIAGNOSIS — N814 Uterovaginal prolapse, unspecified: Secondary | ICD-10-CM

## 2015-04-11 DIAGNOSIS — Z0000 Encounter for general adult medical examination without abnormal findings: Secondary | ICD-10-CM | POA: Diagnosis not present

## 2015-04-11 DIAGNOSIS — Z1151 Encounter for screening for human papillomavirus (HPV): Secondary | ICD-10-CM | POA: Insufficient documentation

## 2015-04-11 DIAGNOSIS — Z01419 Encounter for gynecological examination (general) (routine) without abnormal findings: Secondary | ICD-10-CM | POA: Insufficient documentation

## 2015-04-11 MED ORDER — NORGESTREL-ETHINYL ESTRADIOL 0.3-30 MG-MCG PO TABS: 1.0000 | ORAL_TABLET | Freq: Every day | ORAL | Status: DC

## 2015-04-11 NOTE — Progress Notes (Signed)
  Subjective:     Cynthia Barrett is a 49 y.o. female and is here for a comprehensive physical exam. The patient reports problems - has noticed something lower in her vaginal canal. Thinks it her cervix.  no pain..  Social History   Social History  . Marital Status: Married    Spouse Name: N/A  . Number of Children: N/A  . Years of Education: N/A   Occupational History  . Not on file.   Social History Main Topics  . Smoking status: Never Smoker   . Smokeless tobacco: Never Used  . Alcohol Use: No  . Drug Use: No  . Sexual Activity:    Partners: Male   Other Topics Concern  . Not on file   Social History Narrative   Health Maintenance  Topic Date Due  . HIV Screening  05/24/1981  . MAMMOGRAM  08/06/2013  . INFLUENZA VACCINE  11/22/2015  . PAP SMEAR  08/17/2016  . TETANUS/TDAP  01/30/2017    The following portions of the patient's history were reviewed and updated as appropriate: allergies, current medications, past family history, past medical history, past social history, past surgical history and problem list.  Review of Systems Pertinent items are noted in HPI.   Objective:    BP 133/59 mmHg  Pulse 84  Wt 168 lb (76.204 kg)  SpO2 98% General appearance: alert, cooperative and appears stated age Head: Normocephalic, without obvious abnormality, atraumatic Eyes: conj clear, EOMI,k PEERLA Ears: normal TM's and external ear canals both ears Nose: Nares normal. Septum midline. Mucosa normal. No drainage or sinus tenderness. Throat: lips, mucosa, and tongue normal; teeth and gums normal Neck: no adenopathy, no carotid bruit, no JVD, supple, symmetrical, trachea midline and thyroid not enlarged, symmetric, no tenderness/mass/nodules Back: symmetric, no curvature. ROM normal. No CVA tenderness. Lungs: clear to auscultation bilaterally Breasts: normal appearance, no masses or tenderness Heart: regular rate and rhythm, S1, S2 normal, no murmur, click, rub or  gallop Abdomen: soft, non-tender; bowel sounds normal; no masses,  no organomegaly Pelvic: cervix normal in appearance, external genitalia normal, no adnexal masses or tenderness, no cervical motion tenderness, rectovaginal septum normal, uterus normal size, shape, and consistency, vagina normal without discharge and some uterine and vaginal wall prolapse.  Extremities: extremities normal, atraumatic, no cyanosis or edema Pulses: 2+ and symmetric Skin: Skin color, texture, turgor normal. No rashes or lesions Lymph nodes: Cervical, supraclavicular, and axillary nodes normal. Neurologic: Alert and oriented X 3, normal strength and tone. Normal symmetric reflexes. Normal coordination and gait    Assessment:    Healthy female exam.      Plan:     See After Visit Summary for Counseling Recommendations  Keep up a regular exercise program and make sure you are eating a healthy diet Try to eat 4 servings of dairy a day, or if you are lactose intolerant take a calcium with vitamin D daily.  Your vaccines are up to date.    Will call [facility] [place] ot get last mammogram. Done i the last 6 months.    Will refer to gyn for early uterine prolapse.

## 2015-04-13 LAB — LIPID PANEL
CHOL/HDL RATIO: 3.1 ratio (ref <=5.0)
Cholesterol: 224 mg/dL — ABNORMAL HIGH (ref 125–200)
HDL: 73 mg/dL (ref >=46)
LDL Cholesterol: 128 mg/dL (ref <130)
Triglycerides: 113 mg/dL (ref <150)
VLDL: 23 mg/dL (ref <30)

## 2015-04-13 LAB — COMPLETE METABOLIC PANEL WITH GFR
ALK PHOS: 77 U/L (ref 33–115)
ALT: 15 U/L (ref 6–29)
AST: 19 U/L (ref 10–35)
Albumin: 3.7 g/dL (ref 3.6–5.1)
BUN: 18 mg/dL (ref 7–25)
CALCIUM: 8.8 mg/dL (ref 8.6–10.2)
CO2: 26 mmol/L (ref 20–31)
Chloride: 99 mmol/L (ref 98–110)
Creat: 0.77 mg/dL (ref 0.50–1.10)
GFR, Est African American: >89 mL/min (ref >=60)
Glucose, Bld: 71 mg/dL (ref 65–99)
POTASSIUM: 4.4 mmol/L (ref 3.5–5.3)
SODIUM: 135 mmol/L (ref 135–146)
Total Bilirubin: 0.3 mg/dL (ref 0.2–1.2)
Total Protein: 7 g/dL (ref 6.1–8.1)

## 2015-04-13 LAB — TSH: TSH: 1.324 u[IU]/mL (ref 0.350–4.500)

## 2015-04-13 LAB — CYTOLOGY - PAP

## 2015-04-19 ENCOUNTER — Telehealth: Payer: Self-pay

## 2015-04-19 NOTE — Telephone Encounter (Signed)
Patient aware of pap results and recommendations. 

## 2015-04-19 NOTE — Telephone Encounter (Signed)
-----   Message from Agapito Gamesatherine D Metheney, MD sent at 04/18/2015  7:27 PM EST ----- Call pt: repaet pap in 5 years.

## 2015-06-22 ENCOUNTER — Other Ambulatory Visit: Payer: Self-pay | Admitting: *Deleted

## 2015-06-22 MED ORDER — NORGESTREL-ETHINYL ESTRADIOL 0.3-30 MG-MCG PO TABS: 1.0000 | ORAL_TABLET | Freq: Every day | ORAL | Status: DC

## 2015-07-07 ENCOUNTER — Encounter (HOSPITAL_COMMUNITY): Payer: Self-pay | Admitting: Psychiatry

## 2015-07-07 ENCOUNTER — Ambulatory Visit (INDEPENDENT_AMBULATORY_CARE_PROVIDER_SITE_OTHER): Payer: BLUE CROSS/BLUE SHIELD | Admitting: Psychiatry

## 2015-07-07 VITALS — BP 128/72 | HR 88 | Ht 61.0 in | Wt 169.0 lb

## 2015-07-07 DIAGNOSIS — F3181 Bipolar II disorder: Secondary | ICD-10-CM

## 2015-07-07 DIAGNOSIS — Z639 Problem related to primary support group, unspecified: Secondary | ICD-10-CM

## 2015-07-07 DIAGNOSIS — F411 Generalized anxiety disorder: Secondary | ICD-10-CM

## 2015-07-07 DIAGNOSIS — F938 Other childhood emotional disorders: Secondary | ICD-10-CM

## 2015-07-07 MED ORDER — LAMOTRIGINE 150 MG PO TABS: ORAL_TABLET | ORAL | Status: DC

## 2015-07-07 MED ORDER — CITALOPRAM HYDROBROMIDE 40 MG PO TABS: ORAL_TABLET | ORAL | Status: DC

## 2015-07-07 NOTE — Progress Notes (Signed)
Patient ID: Cynthia Barrett, female   DOB: 10/25/1965, 50 y.o.   MRN: [id number]   [facility]  Cynthia Barrett 06/29/1965 [id number] 50 y.o.  07/07/2015 4:05 PM  Chief Complaint:  HPI Comments: Cynthia Barrett is  a 50 y/o female with a past psychiatric history significant for Bipolar disorder. The patient returns for  medication management.     Mood wise somewhat recovering from mom death from last year.  Husband more understandable and less conflicts when patient has to deal with her daughters and grandkids.  She continues to work and is less stressful  No rash or side effects on meds.   . Severity: Depression: 7/10 (0=Very depressed; 5=Neutral; 10=Very Happy)  Anxiety- 3/10 (0=no anxiety; 5= moderate/tolerable anxiety; 10= panic attacks) Aggravating factor: her mom death. Family issues and her daghter Modifying factor: husband at times supportive. job . Duration:  Mood episode-start at age 50 y/o . Timing: No specific timing . Context74- 22 y/o son died in 2011.     Neurologic: Headache: Negative Seizure: Negative Paresthesias: Negative  Past Medical Family, Social History:  Past Medical History  Diagnosis Date  . Depression   . Anxiety    Family History  Problem Relation Age of Onset  . Diabetes Mother   . Anxiety disorder Mother   . Diabetes Father   . Schizophrenia Paternal Aunt   . Anxiety disorder Maternal Grandmother   . Heart defect Mother    Social History   Social History  . Marital Status: Married    Spouse Name: N/A  . Number of Children: 4  . Years of Education: N/A   Social History Main Topics  . Smoking status: Never Smoker   . Smokeless tobacco: Never Used  . Alcohol Use: No  . Drug Use: No  . Sexual Activity:    Partners: Male   Other Topics Concern  . None   Social History Narrative   No regular exercise.     Outpatient Encounter Prescriptions as of 07/07/2015  Medication Sig  . citalopram  (CELEXA) 40 MG tablet TAKE 1 TABLET (40 MG TOTAL) BY MOUTH DAILY. TAKE 1 TABLET DAILY  . lamoTRIgine (LAMICTAL) 150 MG tablet TAKE 1 TABLET (150 MG TOTAL) BY MOUTH 2 (TWO) TIMES DAILY.  . norgestrel-ethinyl estradiol (CRYSELLE-28) 0.3-30 MG-MCG tablet Take 1 tablet by mouth daily.  Marland Kitchen. PROVENTIL HFA 108 (90 BASE) MCG/ACT inhaler Inhale 2 puffs into the lungs every 4 (four) hours as needed for wheezing or shortness of breath.  . [DISCONTINUED] citalopram (CELEXA) 40 MG tablet TAKE 1 TABLET (40 MG TOTAL) BY MOUTH DAILY. TAKE 1 TABLET DAILY  . [DISCONTINUED] lamoTRIgine (LAMICTAL) 150 MG tablet TAKE 1 TABLET (150 MG TOTAL) BY MOUTH 2 (TWO) TIMES DAILY.   No facility-administered encounter medications on file as of 07/07/2015.     Review of Systems  Constitutional: Negative for fever.  Cardiovascular: Negative for chest pain.  Skin: Negative for itching.  Neurological: Negative for tingling.  Psychiatric/Behavioral: Negative for depression and substance abuse. The patient does not have insomnia.      Physical Exam: Constitutional:  BP 128/72 mmHg  Pulse 88  Ht 5\' 1"  (1.549 m)  Wt 169 lb (76.658 kg)  BMI 31.95 kg/m2  SpO2 96%  General Appearance: alert, oriented, no acute distress Musculoskeletal: Gait & Station: normal Patient leans: N/A  Psychiatric Speciality Exam: General Appearance: Casual and Well Groomed  Eye Contact::  Good  Speech:  Clear and Coherent and Normal  Rate  Volume:  Normal  Mood: euthymic  Affect:  reactive  Thought Process:  Coherent, Linear and Logical  Orientation:  Full (Time, Place, and Person)  Thought Content:  WDL  Suicidal Thoughts:  No  Homicidal Thoughts:  No  Memory:  Immediate;   Good Recent;   Good Remote;   Good  Judgement:  Good  Insight:  Fair  Psychomotor Activity:  Normal  Concentration:  Good  Recall:  Good  Akathisia:  No  Handed:  Right  AIMS (if indicated):     Assets:  Intimacy Resilience Social Support  Language-intact   Fund of knowledge is good     Assessment: Axis I: Bipolar II Disorder- depressed. GAD. Relationship concerns    Plan:  Plan of Care:  PLAN:  1. Affirm with the patient that the medications are taken as ordered. Patient  expressed understanding of how their medications were to be used.    Laboratory: None   Psychotherapy: Therapy: brief supportive therapy provided.  Discussed psychosocial stressors in detail. More than 50% of the visit was spent on individual therapy/counseling. . Provided supportive therapy in regard to her grief and issues with her daughter  Medications:  Continue  the following psychiatric medications as written prior to this appointment with the following changes: a) Lamictal 150 mg BID. For bipolar. No rash b) Citalopram 40 mg. For depression and anxiety Prescriptions sent  Sleep improved.  Relationship stressful at times but more baseline as of now.        Consultations: The patient was encouraged to keep all PCP and specialty clinic appointments.      The patient expressed understanding of the plan and agrees with the above.  Time spent: 25 minutes   Thresa Ross, M.D.  07/07/2015 4:05 PM

## 2015-08-08 ENCOUNTER — Other Ambulatory Visit (HOSPITAL_COMMUNITY): Payer: Self-pay | Admitting: Psychiatry

## 2015-08-08 NOTE — Telephone Encounter (Signed)
Received medication request from [facility] for Celexa 40mg . Per Dr. Gilmore LarocheAkhtar, medication request for Celexa 40mg  is denied. Refill for Celexa 40mg , #90 was sent to [facility] on 07/07/15 for a 90 day supply. Pt is schedule for a f/u appt on 10/04/15.

## 2015-10-04 ENCOUNTER — Encounter (HOSPITAL_COMMUNITY): Payer: Self-pay | Admitting: Psychiatry

## 2015-10-04 ENCOUNTER — Ambulatory Visit (INDEPENDENT_AMBULATORY_CARE_PROVIDER_SITE_OTHER): Payer: BLUE CROSS/BLUE SHIELD | Admitting: Psychiatry

## 2015-10-04 VITALS — BP 122/70 | HR 91 | Ht 61.0 in | Wt 165.0 lb

## 2015-10-04 DIAGNOSIS — F411 Generalized anxiety disorder: Secondary | ICD-10-CM | POA: Diagnosis not present

## 2015-10-04 DIAGNOSIS — F419 Anxiety disorder, unspecified: Secondary | ICD-10-CM | POA: Diagnosis not present

## 2015-10-04 DIAGNOSIS — F418 Other specified anxiety disorders: Secondary | ICD-10-CM

## 2015-10-04 DIAGNOSIS — F3181 Bipolar II disorder: Secondary | ICD-10-CM | POA: Diagnosis not present

## 2015-10-04 DIAGNOSIS — F938 Other childhood emotional disorders: Secondary | ICD-10-CM | POA: Diagnosis not present

## 2015-10-04 DIAGNOSIS — Z639 Problem related to primary support group, unspecified: Secondary | ICD-10-CM

## 2015-10-04 MED ORDER — LAMOTRIGINE 150 MG PO TABS: ORAL_TABLET | ORAL | Status: DC

## 2015-10-04 MED ORDER — PROPRANOLOL HCL 10 MG PO TABS: 10.0000 mg | ORAL_TABLET | Freq: Every day | ORAL | Status: DC

## 2015-10-04 MED ORDER — CITALOPRAM HYDROBROMIDE 40 MG PO TABS: ORAL_TABLET | ORAL | Status: DC

## 2015-10-04 NOTE — Progress Notes (Signed)
Patient ID: Cynthia Barrett, female   DOB: 27-Jan-1966, 50 y.o.   MRN: [id number]   [facility] Follow-up Outpatient Visit  PETRITA Barrett 09-09-1965 [id number] 50 y.o.  10/04/2015 3:39 PM  Chief Complaint:  HPI Comments: Cynthia Barrett is  a 50 y/o female with a past psychiatric history significant for Bipolar disorder. The patient returns for  medication management.    Mood wise not depressed. She has been promoted in her job and now she has to sit in meetings in the late the higher staff or the CEO and she gets anxiety when she has to talk in a meeting or express herself she gets more cautious and she feels she is immersing herself in the past Xanax is helpful and she wants to avoid anything that is habit-forming Husband more understandable and less conflicts when patient has to deal with her daughters and grandkids.  No rash or side effects on meds.   . Severity: Depression: 7/10 (0=Very depressed; 5=Neutral; 10=Very Happy)  Anxiety- 5/10 (0=no anxiety; 5= moderate/tolerable anxiety; 10= panic attacks) . It has worsened Aggravating factor: her mom death. Family issues and her daghter. High stress job due to promotion Modifying factor: husband at times  . Duration:  Mood episode-start at age 67 y/o . Timing: performance anxiety . Context- 59 y/o son died in Sep 05, 2009.     Neurologic: Headache: Negative Seizure: Negative Paresthesias: Negative  Past Medical Family, Social History:  Past Medical History  Diagnosis Date  . Depression   . Anxiety    Family History  Problem Relation Age of Onset  . Diabetes Mother   . Anxiety disorder Mother   . Diabetes Father   . Schizophrenia Paternal Aunt   . Anxiety disorder Maternal Grandmother   . Heart defect Mother    Social History   Social History  . Marital Status: Married    Spouse Name: N/A  . Number of Children: 4  . Years of Education: N/A   Social History Main Topics  . Smoking status: Never Smoker   .  Smokeless tobacco: Never Used  . Alcohol Use: No  . Drug Use: No  . Sexual Activity:    Partners: Male   Other Topics Concern  . None   Social History Narrative   No regular exercise.     Outpatient Encounter Prescriptions as of 10/04/2015  Medication Sig  . citalopram (CELEXA) 40 MG tablet TAKE 1 TABLET (40 MG TOTAL) BY MOUTH DAILY. TAKE 1 TABLET DAILY  . lamoTRIgine (LAMICTAL) 150 MG tablet TAKE 1 TABLET (150 MG TOTAL) BY MOUTH 2 (TWO) TIMES DAILY.  . norgestrel-ethinyl estradiol (CRYSELLE-28) 0.3-30 MG-MCG tablet Take 1 tablet by mouth daily.  Marland Kitchen PROVENTIL HFA 108 (90 BASE) MCG/ACT inhaler Inhale 2 puffs into the lungs every 4 (four) hours as needed for wheezing or shortness of breath.  . [DISCONTINUED] citalopram (CELEXA) 40 MG tablet TAKE 1 TABLET (40 MG TOTAL) BY MOUTH DAILY. TAKE 1 TABLET DAILY  . [DISCONTINUED] lamoTRIgine (LAMICTAL) 150 MG tablet TAKE 1 TABLET (150 MG TOTAL) BY MOUTH 2 (TWO) TIMES DAILY.  Marland Kitchen propranolol (INDERAL) 10 MG tablet Take 1 tablet (10 mg total) by mouth daily. Take half or one tablet for performance anxiety as needed   No facility-administered encounter medications on file as of 10/04/2015.     Review of Systems  Constitutional: Negative for fever.  Cardiovascular: Negative for chest pain.  Skin: Negative for itching.  Neurological: Negative for tingling.  Psychiatric/Behavioral: Negative for depression  and substance abuse. The patient is nervous/anxious. The patient does not have insomnia.      Physical Exam: Constitutional:  BP 122/70 mmHg  Pulse 91  Ht 5\' 1"  (1.549 m)  Wt 165 lb (74.844 kg)  BMI 31.19 kg/m2  SpO2 95%  General Appearance: alert, oriented, no acute distress Musculoskeletal: Gait & Station: normal Patient leans: N/A  Psychiatric Speciality Exam: General Appearance: Casual and Well Groomed  Patent attorneyye Contact::  Good  Speech:  Clear and Coherent and Normal Rate  Volume:  Normal  Mood: euthymic but anxious in meetings   Affect:  reactive  Thought Process:  Coherent, Linear and Logical  Orientation:  Full (Time, Place, and Person)  Thought Content:  WDL  Suicidal Thoughts:  No  Homicidal Thoughts:  No  Memory:  Immediate;   Good Recent;   Good Remote;   Good  Judgement:  Good  Insight:  Fair  Psychomotor Activity:  Normal  Concentration:  Good  Recall:  Good  Akathisia:  No  Handed:  Right  AIMS (if indicated):     Assets:  Intimacy Resilience Social Support  Language-intact  Fund of knowledge is good     Assessment: Axis I: Bipolar II Disorder- depressed. GAD. Relationship concerns    Plan:  Plan of Care:  PLAN:  1. Affirm with the patient that the medications are taken as ordered. Patient  expressed understanding of how their medications were to be used.    Laboratory: None   Psychotherapy: Therapy: brief supportive therapy provided.  Discussed psychosocial stressors in detail. More than 50% of the visit was spent on individual therapy/counseling. . Provided supportive therapy in regard to her grief and issues with her daughter  Medications:  Continue  the following psychiatric medications as written prior to this appointment with the following changes: a) Lamictal 150 mg BID. For bipolar. No rash b) Citalopram 40 mg. For depression and anxiety Prescriptions sent Performance anxiety: add inderal 10mg  half to one tablet prn .explained side effects. Sleep hygiene reviewed  Relationship stressful at times but more baseline as of now.        Consultations: The patient was encouraged to keep all PCP and specialty clinic appointments.      The patient expressed understanding of the plan and agrees with the above. Will follow early for review of performance anxiety and medication effect. Fu in 4 weeks  Time spent: 25 minutes   Thresa RossNADEEM Gionni Freese, M.D.  10/04/2015 3:39 PM

## 2015-10-31 ENCOUNTER — Other Ambulatory Visit (HOSPITAL_COMMUNITY): Payer: Self-pay | Admitting: Psychiatry

## 2015-11-03 NOTE — Telephone Encounter (Signed)
Received medication request from [facility] requesting refill for Propranolol 10mg . Per Dr. Gilmore LarocheAkhtar, refill request is authorized for Propranolol 10mg , #30. Medication was escribed to pharmacy on 11/03/15 with no refills. Pt is schedule for a f/u appt on 11/10/15. Called and informed pt of refill status. Pt verbalizes understanding.

## 2015-11-10 ENCOUNTER — Ambulatory Visit (HOSPITAL_COMMUNITY): Payer: Self-pay | Admitting: Psychiatry

## 2015-12-15 ENCOUNTER — Encounter (HOSPITAL_COMMUNITY): Payer: Self-pay | Admitting: Psychiatry

## 2015-12-15 ENCOUNTER — Ambulatory Visit (INDEPENDENT_AMBULATORY_CARE_PROVIDER_SITE_OTHER): Payer: BLUE CROSS/BLUE SHIELD | Admitting: Psychiatry

## 2015-12-15 DIAGNOSIS — F418 Other specified anxiety disorders: Secondary | ICD-10-CM

## 2015-12-15 DIAGNOSIS — F3181 Bipolar II disorder: Secondary | ICD-10-CM

## 2015-12-15 DIAGNOSIS — F419 Anxiety disorder, unspecified: Secondary | ICD-10-CM | POA: Diagnosis not present

## 2015-12-15 DIAGNOSIS — F938 Other childhood emotional disorders: Secondary | ICD-10-CM

## 2015-12-15 DIAGNOSIS — F411 Generalized anxiety disorder: Secondary | ICD-10-CM | POA: Diagnosis not present

## 2015-12-15 DIAGNOSIS — Z639 Problem related to primary support group, unspecified: Secondary | ICD-10-CM

## 2015-12-15 NOTE — Progress Notes (Signed)
Patient ID: Cynthia Pantherudra M Markos, female   DOB: 04/09/1966, 50 y.o.   MRN: [id number]   [facility]  Cynthia Barrett 10/28/1965 [id number] 50 y.o.  12/15/2015 4:31 PM  Chief Complaint:  HPI Comments: Cynthia Barrett is  a 50 y/o female with a past psychiatric history significant for Bipolar disorder. The patient returns for  medication management.    Mood wise not depressed. She has been promoted in her job and now she has to sit in meetings in the late the higher staff or the CEO and she gets anxiety when she has to talk in a meeting or express herself last visit I give her propranolol that is held for the performance but one time she didn't take it and people notice that she flushed and got all red while talking   She still worries about her daughter but the daughter is now living with her. She keeps someone balance in her life but apparently she is working long hours and she understands it can affect her stress level No rash or side effects on meds on lamictal.    . Severity: Depression: 7/10 (0=Very depressed; 5=Neutral; 10=Very Happy)  Anxiety- 5/10 (0=no anxiety; 5= moderate/tolerable anxiety; 10= panic attacks) . It has worsened Aggravating factor: her mom death. Family issues and her daghter.  stress job due to promotion Modifying factor: husband at times  . Duration:  Mood episode-start at age 50 y/o . Timing: performance anxiety . Context27- 22 y/o son died in 2011.     Neurologic: Headache: Negative Seizure: Negative Paresthesias: Negative  Past Medical Family, Social History:  Past Medical History:  Diagnosis Date  . Anxiety   . Depression    Family History  Problem Relation Age of Onset  . Diabetes Mother   . Anxiety disorder Mother   . Heart defect Mother   . Diabetes Father   . Schizophrenia Paternal Aunt   . Anxiety disorder Maternal Grandmother    Social History   Social History  . Marital status: Married    Spouse name:  N/A  . Number of children: 4  . Years of education: N/A   Social History Main Topics  . Smoking status: Never Smoker  . Smokeless tobacco: Never Used  . Alcohol use No  . Drug use: No  . Sexual activity: Yes    Partners: Male   Other Topics Concern  . None   Social History Narrative   No regular exercise.     Outpatient Encounter Prescriptions as of 12/15/2015  Medication Sig  . citalopram (CELEXA) 40 MG tablet TAKE 1 TABLET (40 MG TOTAL) BY MOUTH DAILY. TAKE 1 TABLET DAILY  . lamoTRIgine (LAMICTAL) 150 MG tablet TAKE 1 TABLET (150 MG TOTAL) BY MOUTH 2 (TWO) TIMES DAILY.  . norgestrel-ethinyl estradiol (CRYSELLE-28) 0.3-30 MG-MCG tablet Take 1 tablet by mouth daily.  . propranolol (INDERAL) 10 MG tablet TAKE 1 TAB BY MOUTH DAILY  . PROVENTIL HFA 108 (90 BASE) MCG/ACT inhaler Inhale 2 puffs into the lungs every 4 (four) hours as needed for wheezing or shortness of breath.   No facility-administered encounter medications on file as of 12/15/2015.      Review of Systems  Constitutional: Negative for fever.  Cardiovascular: Negative for chest pain.  Skin: Negative for itching.  Neurological: Negative for tingling.  Psychiatric/Behavioral: Negative for depression and substance abuse. The patient does not have insomnia.      Physical Exam: Constitutional:  There were no vitals taken  for this visit.  General Appearance: alert, oriented, no acute distress Musculoskeletal: Gait & Station: normal Patient leans: N/A  Psychiatric Speciality Exam: General Appearance: Casual and Well Groomed  Patent attorneyye Contact::  Good  Speech:  Clear and Coherent and Normal Rate  Volume:  Normal  Mood: euthymic but anxious in meetings  Affect:  reactive  Thought Process:  Coherent, Linear and Logical  Orientation:  Full (Time, Place, and Person)  Thought Content:  WDL  Suicidal Thoughts:  No  Homicidal Thoughts:  No  Memory:  Immediate;   Good Recent;   Good Remote;   Good  Judgement:  Good   Insight:  Fair  Psychomotor Activity:  Normal  Concentration:  Good  Recall:  Good  Akathisia:  No  Handed:  Right  AIMS (if indicated):     Assets:  Intimacy Resilience Social Support  Language-intact  Fund of knowledge is good     Assessment: Axis I: Bipolar II Disorder- depressed. GAD. Relationship concerns    Plan:  Plan of Care:  PLAN:  1. Affirm with the patient that the medications are taken as ordered. Patient  expressed understanding of how their medications were to be used.    Laboratory: None   Psychotherapy: Therapy: brief supportive therapy provided.  Discussed psychosocial stressors in detail. More than 50% of the visit was spent on individual therapy/counseling. . Provided supportive therapy in regard to her grief and issues with her daughter  Medications:  Continue  the following psychiatric medications as written prior to this appointment with the following changes: a) Lamictal 150 mg BID. For bipolar. No rash b) Citalopram 40 mg. For depression and anxiety Prescriptions will be called when  Needed. Performance anxiety: add inderal 10mg  half to one tablet prn .explained side effects. Has refill Sleep hygiene reviewed  Relationship stressful at times but more baseline as of now.        Consultations: The patient was encouraged to keep all PCP and specialty clinic appointments.      The patient expressed understanding of the plan and agrees with the above. Will follow early for review of performance anxiety and medication effect. Fu in 3 months  Time spent: 25 minutes   Thresa RossNADEEM Idamay Hosein, M.D.  12/15/2015 4:31 PM

## 2015-12-30 ENCOUNTER — Other Ambulatory Visit (HOSPITAL_COMMUNITY): Payer: Self-pay | Admitting: Psychiatry

## 2016-01-02 NOTE — Telephone Encounter (Signed)
Received fax from [facility] requesting a refill for Celexa. Per Dr. Gilmore LarocheAkhtar, refill is authorize for Celexa 40mg , #90. Rx was electronically sent to pharmacy. Pt is schedule for a f/u appt on 11/16. Called and informed pt of refill status. Pt verbalizes understanding.

## 2016-01-26 ENCOUNTER — Other Ambulatory Visit (HOSPITAL_COMMUNITY): Payer: Self-pay | Admitting: Psychiatry

## 2016-01-31 NOTE — Telephone Encounter (Signed)
Medication refill- Received fax from [facility] requesting a refill for Lamictal. Per Dr. Gilmore LarocheAkhtar, refill is authorized for Lamictal 150mg , #180. Rx was sent to pharmacy. Pt is schedule for a f/u appt on 11/16. Called and informed pt of refill status. Pt verbalize understanding.

## 2016-03-08 ENCOUNTER — Encounter (HOSPITAL_COMMUNITY): Payer: Self-pay | Admitting: Psychiatry

## 2016-03-08 ENCOUNTER — Ambulatory Visit (INDEPENDENT_AMBULATORY_CARE_PROVIDER_SITE_OTHER): Payer: BLUE CROSS/BLUE SHIELD | Admitting: Psychiatry

## 2016-03-08 VITALS — BP 138/84 | HR 80 | Wt 166.0 lb

## 2016-03-08 DIAGNOSIS — Z639 Problem related to primary support group, unspecified: Secondary | ICD-10-CM

## 2016-03-08 DIAGNOSIS — F3181 Bipolar II disorder: Secondary | ICD-10-CM | POA: Diagnosis not present

## 2016-03-08 DIAGNOSIS — Z8249 Family history of ischemic heart disease and other diseases of the circulatory system: Secondary | ICD-10-CM

## 2016-03-08 DIAGNOSIS — F411 Generalized anxiety disorder: Secondary | ICD-10-CM

## 2016-03-08 DIAGNOSIS — Z833 Family history of diabetes mellitus: Secondary | ICD-10-CM

## 2016-03-08 DIAGNOSIS — F418 Other specified anxiety disorders: Secondary | ICD-10-CM

## 2016-03-08 DIAGNOSIS — Z79899 Other long term (current) drug therapy: Secondary | ICD-10-CM

## 2016-03-08 DIAGNOSIS — Z818 Family history of other mental and behavioral disorders: Secondary | ICD-10-CM

## 2016-03-08 MED ORDER — LAMOTRIGINE 150 MG PO TABS: ORAL_TABLET | ORAL | 0 refills | Status: DC

## 2016-03-08 MED ORDER — CITALOPRAM HYDROBROMIDE 40 MG PO TABS: ORAL_TABLET | ORAL | 0 refills | Status: DC

## 2016-03-08 NOTE — Progress Notes (Signed)
Patient ID: Jolayne Pantherudra M Dettmann, female   DOB: 03/18/1966, 50 y.o.   MRN: [id number]   [facility]  Jolayne Pantherudra M Gonyea 08/19/1965 [id number] 50 y.o.  03/08/2016 4:34 PM  Chief Complaint:  HPI Comments: [name]  a 50 y/o female with a past psychiatric history significant for Bipolar disorder. The patient returns for  medication management.    Mood wise and regarding bipolar she is more stable. Her daughters come back from Hospital she was admitted because of heart infection related to IV drug use Diagnoses significant stress for her. She still worries about her daughter but the daughter. No rash reported on Lamictal. Depression; not worsened she is taking citalopram not feeling hopeless Performance anxiety: not needing inderal since meeting group has been changed .  Relationship conflict; her husband does get worried when the patient starts worrying too much about her own family or her daughters but they're trying to manage it with more flexibility . Severity: Depression: 7/10 (0=Very depressed; 5=Neutral; 10=Very Happy)  Anxiety- 5/10 (0=no anxiety; 5= moderate/tolerable anxiety; 10= panic attacks) . It has worsened Aggravating factor: her mom death. Family issues and her daghter.  stress job due to promotion Modifying factor: husband at times  . Duration:  Mood episode-start at age 50 y/o . Timing: performance anxiety . Context12- 22 y/o son died in 2011.     Neurologic: Headache: Negative Seizure: Negative Paresthesias: Negative  Past Medical Family, Social History:  Past Medical History:  Diagnosis Date  . Anxiety   . Depression    Family History  Problem Relation Age of Onset  . Diabetes Mother   . Anxiety disorder Mother   . Heart defect Mother   . Diabetes Father   . Schizophrenia Paternal Aunt   . Anxiety disorder Maternal Grandmother    Social History   Social History  . Marital status: Married    Spouse name: N/A  .  Number of children: 4  . Years of education: N/A   Social History Main Topics  . Smoking status: Never Smoker  . Smokeless tobacco: Never Used  . Alcohol use No  . Drug use: No  . Sexual activity: Yes    Partners: Male   Other Topics Concern  . None   Social History Narrative   No regular exercise.     Outpatient Encounter Prescriptions as of 03/08/2016  Medication Sig  . citalopram (CELEXA) 40 MG tablet TAKE 1 TABLET (40 MG TOTAL) BY MOUTH DAILY. TAKE 1 TABLET DAILY  . lamoTRIgine (LAMICTAL) 150 MG tablet TAKE 1 TABLET (150 MG TOTAL) BY MOUTH 2 (TWO) TIMES DAILY.  . norgestrel-ethinyl estradiol (CRYSELLE-28) 0.3-30 MG-MCG tablet Take 1 tablet by mouth daily.  . propranolol (INDERAL) 10 MG tablet TAKE 1 TAB BY MOUTH DAILY  . PROVENTIL HFA 108 (90 BASE) MCG/ACT inhaler Inhale 2 puffs into the lungs every 4 (four) hours as needed for wheezing or shortness of breath.  . [DISCONTINUED] citalopram (CELEXA) 40 MG tablet TAKE 1 TABLET (40 MG TOTAL) BY MOUTH DAILY. TAKE 1 TABLET DAILY  . [DISCONTINUED] lamoTRIgine (LAMICTAL) 150 MG tablet TAKE 1 TABLET (150 MG TOTAL) BY MOUTH 2 (TWO) TIMES DAILY.   No facility-administered encounter medications on file as of 03/08/2016.      Review of Systems  Constitutional: Negative for fever.  Cardiovascular: Negative for palpitations.  Skin: Negative for rash.  Neurological: Negative for tingling.  Psychiatric/Behavioral: Negative for depression and substance abuse. The patient does not have insomnia.  Physical Exam: Constitutional:  BP 138/84   Pulse 80   Wt 166 lb (75.3 kg)   BMI 31.37 kg/m   General Appearance: alert, oriented, no acute distress Musculoskeletal: Gait & Station: normal Patient leans: N/A  Psychiatric Speciality Exam: General Appearance: Casual and Well Groomed  Patent attorneyye Contact::  Good  Speech:  Clear and Coherent and Normal Rate  Volume:  Normal  Mood: euthymic  Affect:  reactive  Thought Process:  Coherent,  Linear and Logical  Orientation:  Full (Time, Place, and Person)  Thought Content:  WDL  Suicidal Thoughts:  No  Homicidal Thoughts:  No  Memory:  Immediate;   Good Recent;   Good Remote;   Good  Judgement:  Good  Insight:  Fair  Psychomotor Activity:  Normal  Concentration:  Good  Recall:  Good  Akathisia:  No  Handed:  Right  AIMS (if indicated):     Assets:  Intimacy Resilience Social Support  Language-intact  Fund of knowledge is good     Assessment: Axis I: Bipolar II Disorder- depressed. GAD. Relationship concerns    Plan:  Plan of Care:  PLAN:  1. Affirm with the patient that the medications are taken as ordered. Patient  expressed understanding of how their medications were to be used.    Laboratory: None   Psychotherapy: Therapy: brief supportive therapy provided.  Discussed psychosocial stressors in detail. More than 50% of the visit was spent on individual therapy/counseling. . Provided supportive therapy in regard to her grief and issues with her daughter  Medications:  Continue  the following psychiatric medications as written prior to this appointment with the following changes: a) Lamictal 150 mg BID. For bipolar. No rash b) Citalopram 40 mg. For depression and anxiety Prescriptions sent  Performance anxiety: not needing inderal for now but has meds Sleep hygiene reviewed  Relationship stressful at times but more baseline as of now.        Consultations: The patient was encouraged to keep all PCP and specialty clinic appointments.      The patient expressed understanding of the plan and agrees with the above. Will follow early for review of performance anxiety and medication effect. Fu in 3 months  Time spent: 25 minutes   Thresa RossNADEEM Adithya Difrancesco, M.D.  03/08/2016 4:34 PM

## 2016-04-10 ENCOUNTER — Ambulatory Visit (INDEPENDENT_AMBULATORY_CARE_PROVIDER_SITE_OTHER): Payer: BLUE CROSS/BLUE SHIELD | Admitting: Family Medicine

## 2016-04-10 ENCOUNTER — Encounter: Payer: Self-pay | Admitting: Family Medicine

## 2016-04-10 VITALS — BP 125/65 | HR 82 | Ht 61.0 in | Wt 169.0 lb

## 2016-04-10 DIAGNOSIS — Z0000 Encounter for general adult medical examination without abnormal findings: Secondary | ICD-10-CM

## 2016-04-10 DIAGNOSIS — Z1231 Encounter for screening mammogram for malignant neoplasm of breast: Secondary | ICD-10-CM

## 2016-04-10 DIAGNOSIS — R7989 Other specified abnormal findings of blood chemistry: Secondary | ICD-10-CM

## 2016-04-10 LAB — TSH: TSH: 2.37 m[IU]/L

## 2016-04-10 NOTE — Progress Notes (Addendum)
  Subjective:     Cynthia Barrett is a 50 y.o. female and is here for a comprehensive physical exam. The patient reports no problems.Her only concern today is that she has gained weight. She says is the heaviest she's been in her entire life. They she is not exercising. She is currently on birth control help control heavy periods.  Social History   Social History  . Marital status: Married    Spouse name: N/A  . Number of children: 4  . Years of education: N/A   Occupational History  . Not on file.   Social History Main Topics  . Smoking status: Never Smoker  . Smokeless tobacco: Never Used  . Alcohol use No  . Drug use: No  . Sexual activity: Yes    Partners: Male   Other Topics Concern  . Not on file   Social History Narrative   No regular exercise.    Health Maintenance  Topic Date Due  . HIV Screening  05/24/1981  . MAMMOGRAM  06/27/2015  . TETANUS/TDAP  01/30/2017  . PAP SMEAR  04/10/2018  . INFLUENZA VACCINE  Addressed    The following portions of the patient's history were reviewed and updated as appropriate: allergies, current medications, past family history, past medical history, past social history, past surgical history and problem list.  Review of Systems A comprehensive review of systems was negative.   Objective:    BP 125/65   Pulse 82   Ht 5\' 1"  (1.549 m)   Wt 169 lb (76.7 kg)   SpO2 99%   BMI 31.93 kg/m  General appearance: alert, cooperative and appears stated age Head: Normocephalic, without obvious abnormality, atraumatic Eyes: conj clear, EOMi, PEERLA Ears: normal TM's and external ear canals both ears Nose: Nares normal. Septum midline. Mucosa normal. No drainage or sinus tenderness. Throat: lips, mucosa, and tongue normal; teeth and gums normal Neck: no adenopathy, no carotid bruit, no JVD, supple, symmetrical, trachea midline and thyroid not enlarged, symmetric, no tenderness/mass/nodules Back: symmetric, no curvature. ROM normal. No  CVA tenderness. Lungs: clear to auscultation bilaterally Breasts: normal appearance, no masses or tenderness Heart: regular rate and rhythm, S1, S2 normal, no murmur, click, rub or gallop Abdomen: soft, non-tender; bowel sounds normal; no masses,  no organomegaly Extremities: extremities normal, atraumatic, no cyanosis or edema Pulses: 2+ and symmetric Skin: Skin color, texture, turgor normal. No rashes or lesions Lymph nodes: Cervical, supraclavicular, and axillary nodes normal. Neurologic: Alert and oriented X 3, normal strength and tone. Normal symmetric reflexes. Normal coordination and gait    Assessment:    Healthy female exam.      Plan:     See After Visit Summary for Counseling Recommendations   Keep up a regular exercise program and make sure you are eating a healthy diet Try to eat 4 servings of dairy a day, or if you are lactose intolerant take a calcium with vitamin D daily.  Your vaccines are up to date.    BMI 31-we did discuss getting on track with diet and exercise and using a smart phone application call my fitness pal to help retract calories and set goals. She is not currently exercising.  Discussed need for colon cancer screening. She turns 51 in 2 months. Discussed options and she is much is interested in coloGuard. Just encouraged her to call me after her birthday and we can get the sent in and do her colon cancer screening.

## 2016-04-10 NOTE — Patient Instructions (Signed)
Keep up a regular exercise program and make sure you are eating a healthy diet Try to eat 4 servings of dairy a day, or if you are lactose intolerant take a calcium with vitamin D daily.  Your vaccines are up to date.   

## 2016-04-11 LAB — LIPID PANEL
Cholesterol: 202 mg/dL — ABNORMAL HIGH (ref <200)
HDL: 58 mg/dL (ref >50)
LDL CALC: 116 mg/dL — AB (ref <100)
Total CHOL/HDL Ratio: 3.5 Ratio (ref <5.0)
Triglycerides: 139 mg/dL (ref <150)
VLDL: 28 mg/dL (ref <30)

## 2016-04-11 LAB — COMPLETE METABOLIC PANEL WITH GFR
ALBUMIN: 3.7 g/dL (ref 3.6–5.1)
ALK PHOS: 61 U/L (ref 33–115)
ALT: 12 U/L (ref 6–29)
AST: 15 U/L (ref 10–35)
BILIRUBIN TOTAL: 0.4 mg/dL (ref 0.2–1.2)
BUN: 14 mg/dL (ref 7–25)
CO2: 23 mmol/L (ref 20–31)
Calcium: 8.2 mg/dL — ABNORMAL LOW (ref 8.6–10.2)
Chloride: 103 mmol/L (ref 98–110)
Creat: 0.91 mg/dL (ref 0.50–1.10)
GFR, Est African American: 86 mL/min (ref >=60)
GFR, Est Non African American: 74 mL/min (ref >=60)
GLUCOSE: 85 mg/dL (ref 65–99)
Potassium: 4.2 mmol/L (ref 3.5–5.3)
SODIUM: 136 mmol/L (ref 135–146)
TOTAL PROTEIN: 6.9 g/dL (ref 6.1–8.1)

## 2016-04-11 NOTE — Addendum Note (Signed)
Addended by: Deno EtienneBARKLEY, Si Jachim L on: 04/11/2016 02:29 PM   Modules accepted: Orders

## 2016-05-29 ENCOUNTER — Ambulatory Visit (INDEPENDENT_AMBULATORY_CARE_PROVIDER_SITE_OTHER): Payer: Self-pay | Admitting: Psychiatry

## 2016-05-29 ENCOUNTER — Encounter (HOSPITAL_COMMUNITY): Payer: Self-pay | Admitting: Psychiatry

## 2016-05-29 VITALS — BP 124/76 | HR 90 | Resp 16 | Ht 61.0 in | Wt 170.0 lb

## 2016-05-29 DIAGNOSIS — Z8249 Family history of ischemic heart disease and other diseases of the circulatory system: Secondary | ICD-10-CM

## 2016-05-29 DIAGNOSIS — F418 Other specified anxiety disorders: Secondary | ICD-10-CM

## 2016-05-29 DIAGNOSIS — F411 Generalized anxiety disorder: Secondary | ICD-10-CM

## 2016-05-29 DIAGNOSIS — Z639 Problem related to primary support group, unspecified: Secondary | ICD-10-CM

## 2016-05-29 DIAGNOSIS — Z833 Family history of diabetes mellitus: Secondary | ICD-10-CM

## 2016-05-29 DIAGNOSIS — Z79899 Other long term (current) drug therapy: Secondary | ICD-10-CM

## 2016-05-29 DIAGNOSIS — Z818 Family history of other mental and behavioral disorders: Secondary | ICD-10-CM

## 2016-05-29 DIAGNOSIS — F3181 Bipolar II disorder: Secondary | ICD-10-CM

## 2016-05-29 MED ORDER — LAMOTRIGINE 150 MG PO TABS: ORAL_TABLET | ORAL | 0 refills | Status: DC

## 2016-05-29 MED ORDER — CITALOPRAM HYDROBROMIDE 40 MG PO TABS: ORAL_TABLET | ORAL | 0 refills | Status: DC

## 2016-05-29 NOTE — Progress Notes (Signed)
Patient ID: Cynthia Barrett, female   DOB: December 20, 1965, 51 y.o.   MRN: [id number]   [facility] Follow-up Outpatient Visit  Cynthia Barrett 03/13/1966 [id number] 51 y.o.  05/29/2016 4:38 PM  Chief Complaint:  HPI Comments: Cynthia Barrett is  a 51 y/o female with a past psychiatric history significant for Bipolar disorder. The patient returns for  medication management.    Mood wise she is doing reasonable although she has lost her job because of downsizing of the company. She is trying to work on [employer] and getting a store out of it. Her daughter is doing reasonable she had infection in the past she is not using drugs anymore. Performance anxiety; she is not working so she does not need inderal. Relationship: not conflicting as of now No rash reported  Depression; not worsened she is taking citalopram not feeling hopeless Performance anxiety: not needing inderal since meeting group has been changed .   Depression: 7/10 (0=Very depressed; 5=Neutral; 10=Very Happy)  Anxiety- 5/10 (0=no anxiety; 5= moderate/tolerable anxiety; 10= panic attacks) . It has worsened Aggravating factor: her moms death . Daughter used drugs Modifying factor: husband at times  . Duration:  Mood episode-start at age 46 y/o . Timing: performance anxiety . Context- 62 y/o son died in September 24, 2009.      Past Medical Family, Social History:  Past Medical History:  Diagnosis Date  . Anxiety   . Depression    Family History  Problem Relation Age of Onset  . Diabetes Mother   . Anxiety disorder Mother   . Heart defect Mother   . Diabetes Father   . Schizophrenia Paternal Aunt   . Anxiety disorder Maternal Grandmother    Social History   Social History  . Marital status: Married    Spouse name: N/A  . Number of children: 4  . Years of education: N/A   Social History Main Topics  . Smoking status: Never Smoker  . Smokeless tobacco: Never Used  . Alcohol use No  . Drug use: No  . Sexual activity: Yes     Partners: Male   Other Topics Concern  . None   Social History Narrative   No regular exercise.     Outpatient Encounter Prescriptions as of 05/29/2016  Medication Sig  . citalopram (CELEXA) 40 MG tablet TAKE 1 TABLET (40 MG TOTAL) BY MOUTH DAILY. TAKE 1 TABLET DAILY  . lamoTRIgine (LAMICTAL) 150 MG tablet TAKE 1 TABLET (150 MG TOTAL) BY MOUTH 2 (TWO) TIMES DAILY.  . norgestrel-ethinyl estradiol (CRYSELLE-28) 0.3-30 MG-MCG tablet Take 1 tablet by mouth daily.  Marland Kitchen PROVENTIL HFA 108 (90 BASE) MCG/ACT inhaler Inhale 2 puffs into the lungs every 4 (four) hours as needed for wheezing or shortness of breath.  . [DISCONTINUED] citalopram (CELEXA) 40 MG tablet TAKE 1 TABLET (40 MG TOTAL) BY MOUTH DAILY. TAKE 1 TABLET DAILY  . [DISCONTINUED] lamoTRIgine (LAMICTAL) 150 MG tablet TAKE 1 TABLET (150 MG TOTAL) BY MOUTH 2 (TWO) TIMES DAILY.  . [DISCONTINUED] propranolol (INDERAL) 10 MG tablet TAKE 1 TAB BY MOUTH DAILY   No facility-administered encounter medications on file as of 05/29/2016.      Review of Systems  Constitutional: Negative for fever.  Cardiovascular: Negative for chest pain.  Skin: Negative for rash.  Neurological: Negative for tingling.  Psychiatric/Behavioral: Negative for depression and substance abuse. The patient does not have insomnia.      Physical Exam: Constitutional:  BP 124/76 (BP Location: Right Arm, Patient Position: Sitting,  Cuff Size: Normal)   Pulse 90   Resp 16   Ht 5\' 1"  (1.549 m)   Wt 170 lb (77.1 kg)   SpO2 98%   BMI 32.12 kg/m   General Appearance: alert, oriented, no acute distress   Psychiatric Speciality Exam: General Appearance: Casual and Well Groomed  Eye Contact::  Good  Speech:  Clear and Coherent and Normal Rate  Volume:  Normal  Mood: euthymic  Affect:  reactive  Thought Process:  Coherent, Linear and Logical  Orientation:  Full (Time, Place, and Person)  Thought Content:  WDL  Suicidal Thoughts:  No  Homicidal Thoughts:  No   Memory:  Immediate;   Good Recent;   Good Remote;   Good  Judgement:  Good  Insight:  Fair  Psychomotor Activity:  Normal  Concentration:  Good  Recall:  Good  Akathisia:  No  Handed:  Right  AIMS (if indicated):     Assets:  Intimacy Resilience Social Support  Language-intact  Fund of knowledge is good     Assessment: Axis I: Bipolar II Disorder- depressed. GAD. Relationship concerns    Plan:  Plan of Care:  PLAN:  1. Affirm with the patient that the medications are taken as ordered. Patient  expressed understanding of how their medications were to be used.    Laboratory: None   Psychotherapy: Therapy: brief supportive therapy provided.  Discussed psychosocial stressors in detail. More than 50% of the visit was spent on individual therapy/counseling. . Provided supportive therapy in regard to her grief and issues with her daughter  Medications:  Continue  the following psychiatric medications  Bipolar: continue lamictal 150mg  bid Depression and anxiety: celexa no dose change Sent 90 day supply  Performance anxiety: not needing inderal as of now Sleep hygiene reviewed  Relationship stressful at times but more baseline as of now.        Consultations: The patient was encouraged to keep all PCP and specialty clinic appointments.      The patient expressed understanding of the plan and agrees with the above. Will follow early for review of performance anxiety and medication effect. FU 3 months    Thresa RossNADEEM Arnet Hofferber, M.D.  05/29/2016 4:38 PM

## 2016-07-17 ENCOUNTER — Encounter (HOSPITAL_COMMUNITY): Payer: Self-pay | Admitting: Psychiatry

## 2016-07-17 ENCOUNTER — Ambulatory Visit (INDEPENDENT_AMBULATORY_CARE_PROVIDER_SITE_OTHER): Payer: BLUE CROSS/BLUE SHIELD | Admitting: Psychiatry

## 2016-07-17 VITALS — BP 126/74 | HR 95 | Resp 18 | Ht 61.0 in

## 2016-07-17 DIAGNOSIS — Z818 Family history of other mental and behavioral disorders: Secondary | ICD-10-CM | POA: Diagnosis not present

## 2016-07-17 DIAGNOSIS — F3181 Bipolar II disorder: Secondary | ICD-10-CM | POA: Diagnosis not present

## 2016-07-17 DIAGNOSIS — Z79899 Other long term (current) drug therapy: Secondary | ICD-10-CM | POA: Diagnosis not present

## 2016-07-17 DIAGNOSIS — F411 Generalized anxiety disorder: Secondary | ICD-10-CM | POA: Diagnosis not present

## 2016-07-17 MED ORDER — ALPRAZOLAM 0.5 MG PO TABS: 1.0000 mg | ORAL_TABLET | Freq: Every day | ORAL | 0 refills | Status: DC | PRN

## 2016-07-17 MED ORDER — BUPROPION HCL ER (SR) 100 MG PO TB12: 100.0000 mg | ORAL_TABLET | Freq: Every day | ORAL | 0 refills | Status: DC

## 2016-07-17 NOTE — Progress Notes (Signed)
Patient ID: Cynthia Barrett, female   DOB: 1966/02/16, 51 y.o.   MRN: [id number]   [facility] Follow-up Outpatient Visit  Cynthia Barrett 1965/10/01 [id number] 51 y.o.  07/17/2016 8:54 AM  Chief Complaint:  HPI Comments: Cynthia Barrett is  a 51 y/o female with a past psychiatric history significant for Bipolar disorder. The patient returns for  medication management.   Patient depression anxiety has gotten worse distressed about her daughter who is relapsing again. This conflicts between the other daughter because she does not want to invite her at the marriage. She worries a lot about her daughter about addiction and if she may end up with overdose. Relationship husband becomes rocky sometimes because the patient focuses too much on her daughter.   Depression worse:  No psychosis Aggravating factor: daughters drug use. No job     Past Medical Family, Social History:  Past Medical History:  Diagnosis Date  . Anxiety   . Depression    Family History  Problem Relation Age of Onset  . Diabetes Mother   . Anxiety disorder Mother   . Heart defect Mother   . Diabetes Father   . Schizophrenia Paternal Aunt   . Anxiety disorder Maternal Grandmother    Social History   Social History  . Marital status: Married    Spouse name: N/A  . Number of children: 4  . Years of education: N/A   Social History Main Topics  . Smoking status: Never Smoker  . Smokeless tobacco: Never Used  . Alcohol use No  . Drug use: No  . Sexual activity: Yes    Partners: Male   Other Topics Concern  . None   Social History Narrative   No regular exercise.     Outpatient Encounter Prescriptions as of 07/17/2016  Medication Sig  . citalopram (CELEXA) 40 MG tablet TAKE 1 TABLET (40 MG TOTAL) BY MOUTH DAILY. TAKE 1 TABLET DAILY  . lamoTRIgine (LAMICTAL) 150 MG tablet TAKE 1 TABLET (150 MG TOTAL) BY MOUTH 2 (TWO) TIMES DAILY.  . norgestrel-ethinyl estradiol (CRYSELLE-28) 0.3-30 MG-MCG  tablet Take 1 tablet by mouth daily.  Marland Kitchen PROVENTIL HFA 108 (90 BASE) MCG/ACT inhaler Inhale 2 puffs into the lungs every 4 (four) hours as needed for wheezing or shortness of breath.  . ALPRAZolam (XANAX) 0.5 MG tablet Take 2 tablets (1 mg total) by mouth daily as needed for anxiety.  Marland Kitchen buPROPion (WELLBUTRIN SR) 100 MG 12 hr tablet Take 1 tablet (100 mg total) by mouth daily.   No facility-administered encounter medications on file as of 07/17/2016.      Review of Systems  Constitutional: Negative for fever.  Cardiovascular: Negative for palpitations.  Skin: Negative for rash.  Neurological: Negative for tingling.  Psychiatric/Behavioral: Positive for depression. Negative for substance abuse. The patient is nervous/anxious. The patient does not have insomnia.      Physical Exam: Constitutional:  BP 126/74 (BP Location: Right Arm, Patient Position: Sitting, Cuff Size: Normal)   Pulse 95   Resp 18   Ht 5\' 1"  (1.549 m)   SpO2 97%   General Appearance: alert, oriented, no acute distress   Psychiatric Speciality Exam: General Appearance: Casual and Well Groomed  Eye Contact::  Good  Speech:  Clear and Coherent and Normal Rate  Volume:  Normal  Mood:depressed  Affect: depressed  Thought Process:  Coherent, Linear and Logical  Orientation:  Full (Time, Place, and Person)  Thought Content:  WDL  Suicidal Thoughts:  No  Homicidal Thoughts:  No  Memory:  Immediate;   Good Recent;   Good Remote;   Good  Judgement:  Good  Insight:  Fair  Psychomotor Activity:  Normal  Concentration:  Good  Recall:  Good  Akathisia:  No  Handed:  Right  AIMS (if indicated):     Assets:  Intimacy Resilience Social Support  Language-intact  Fund of knowledge is good     Assessment: Axis I: Bipolar II Disorder- depressed. GAD. Relationship concerns    Plan:  Plan of Care:  PLAN:  1. Affirm with the patient that the medications are taken as ordered. Patient  expressed understanding  of how their medications were to be used.    Laboratory: None   Psychotherapy: Therapy: brief supportive therapy provided.  Discussed psychosocial stressors in detail. More than 50% of the visit was spent on individual therapy/counseling. . Provided supportive therapy in regard to her grief and issues with her daughter  Medications:  Continue  the following psychiatric medications  Bipolar: depression worse. Continue lamictal. Add wellbutrin 100mg  durring the day Depression and anxiety: continue celexa and add wellbutrin Anxiety is worse: some panic attacks. Add xanax 0.5mg  qd prn   Assign worry time to worry and after that focus on her life and reviewed sleep hygiene. Not interested in therapy      Consultations: The patient was encouraged to keep all PCP and specialty clinic appointments.      FU 1 month   Cynthia Barrett, M.D.  07/17/2016 8:54 AM

## 2016-08-13 ENCOUNTER — Other Ambulatory Visit (HOSPITAL_COMMUNITY): Payer: Self-pay | Admitting: Psychiatry

## 2016-08-14 ENCOUNTER — Ambulatory Visit (INDEPENDENT_AMBULATORY_CARE_PROVIDER_SITE_OTHER): Payer: BLUE CROSS/BLUE SHIELD | Admitting: Psychiatry

## 2016-08-14 ENCOUNTER — Encounter (HOSPITAL_COMMUNITY): Payer: Self-pay | Admitting: Psychiatry

## 2016-08-14 VITALS — BP 116/68 | HR 84 | Resp 16 | Ht 61.0 in | Wt 167.0 lb

## 2016-08-14 DIAGNOSIS — Z639 Problem related to primary support group, unspecified: Secondary | ICD-10-CM

## 2016-08-14 DIAGNOSIS — F3181 Bipolar II disorder: Secondary | ICD-10-CM | POA: Diagnosis not present

## 2016-08-14 DIAGNOSIS — Z818 Family history of other mental and behavioral disorders: Secondary | ICD-10-CM

## 2016-08-14 DIAGNOSIS — F411 Generalized anxiety disorder: Secondary | ICD-10-CM

## 2016-08-14 DIAGNOSIS — Z79899 Other long term (current) drug therapy: Secondary | ICD-10-CM

## 2016-08-14 MED ORDER — BUPROPION HCL ER (SR) 100 MG PO TB12: 100.0000 mg | ORAL_TABLET | Freq: Every day | ORAL | 1 refills | Status: DC

## 2016-08-14 NOTE — Progress Notes (Signed)
Patient ID: Cynthia Barrett, female   DOB: 04/06/1966, 51 y.o.   MRN: [id number]   [name]  Cynthia Barrett 04/17/1966 [id number] 51 y.o.  08/14/2016 3:38 PM  Chief Complaint:  HPI Comments: Cynthia Barrett is  a 51 y/o female with a past psychiatric history significant for Bipolar disorder. The patient returns for  medication management.    Patient was a motivated decreased energy and depressed last visit we added Wellbutrin and she was concerned about her daughter now she is feeling somewhat better more alert she's tried to get more responsibility to the daughter and not get sucked into her problems. Xanax prn has helped the anxiety as well.she takes it infrequently. Overall she is improved in her mood she still not having a job so that as some financial told what she is not endorsing significant panic attacks.  Depression: improved No psychosis Aggravating factor: daughters drug use    Past Medical Family, Social History:  Past Medical History:  Diagnosis Date  . Anxiety   . Depression    Family History  Problem Relation Age of Onset  . Diabetes Mother   . Anxiety disorder Mother   . Heart defect Mother   . Diabetes Father   . Schizophrenia Paternal Aunt   . Anxiety disorder Maternal Grandmother    Social History   Social History  . Marital status: Married    Spouse name: N/A  . Number of children: 4  . Years of education: N/A   Social History Main Topics  . Smoking status: Never Smoker  . Smokeless tobacco: Never Used  . Alcohol use No  . Drug use: No  . Sexual activity: Yes    Partners: Male   Other Topics Concern  . None   Social History Narrative   No regular exercise.     Outpatient Encounter Prescriptions as of 08/14/2016  Medication Sig  . ALPRAZolam (XANAX) 0.5 MG tablet Take 2 tablets (1 mg total) by mouth daily as needed for anxiety.  Marland Kitchen buPROPion (WELLBUTRIN SR) 100 MG 12 hr tablet Take 1 tablet (100 mg  total) by mouth daily.  . citalopram (CELEXA) 40 MG tablet TAKE 1 TABLET (40 MG TOTAL) BY MOUTH DAILY. TAKE 1 TABLET DAILY  . lamoTRIgine (LAMICTAL) 150 MG tablet TAKE 1 TABLET (150 MG TOTAL) BY MOUTH 2 (TWO) TIMES DAILY.  . norgestrel-ethinyl estradiol (CRYSELLE-28) 0.3-30 MG-MCG tablet Take 1 tablet by mouth daily.  Marland Kitchen PROVENTIL HFA 108 (90 BASE) MCG/ACT inhaler Inhale 2 puffs into the lungs every 4 (four) hours as needed for wheezing or shortness of breath.   No facility-administered encounter medications on file as of 08/14/2016.      Review of Systems  Constitutional: Negative for fever.  Cardiovascular: Negative for chest pain.  Skin: Negative for rash.  Neurological: Negative for tingling.  Psychiatric/Behavioral: Positive for depression. Negative for substance abuse. The patient does not have insomnia.      Physical Exam: Constitutional:  There were no vitals taken for this visit.  General Appearance: alert, oriented, no acute distress   Psychiatric Speciality Exam: General Appearance: Casual and Well Groomed  Eye Contact::  Good  Speech:  Clear and Coherent and Normal Rate  Volume:  Normal  Less dysthymic mood  Reactive affect  Thought Process:  Coherent, Linear and Logical  Orientation:  Full (Time, Place, and Person)  Thought Content:  WDL  Suicidal Thoughts:  No  Homicidal Thoughts:  No  Memory:  Immediate;  Good Recent;   Good Remote;   Good  Judgement:  Good  Insight:  Fair  Psychomotor Activity:  Normal  Concentration:  Good  Recall:  Good  Akathisia:  No  Handed:  Right  AIMS (if indicated):     Assets:  Intimacy Resilience Social Support  Language-intact  Fund of knowledge is good     Assessment: Axis I: Bipolar II Disorder- depressed. GAD. Relationship concerns    Plan:  Plan of Care:  PLAN:  1. Affirm with the patient that the medications are taken as ordered. Patient  expressed understanding of how their medications were to be used.     Laboratory: None   Psychotherapy: Therapy: brief supportive therapy provided.  Discussed psychosocial stressors in detail. More than 50% of the visit was spent on individual therapy/counseling. . Provided supportive therapy in regard to her grief and issues with her daughter  Medications:  Continue  the following psychiatric medications  Bipolar: depression : improved. Continue lamictal. She has. Continue wellbutrin  sent more refills Depression and anxiety: improved. Continue celexa and wellbutrin Anxiety has improved. Taking prn xanax onlyu         Consultations: The patient was encouraged to keep all PCP and specialty clinic appointments.       patient's condition is improved since adding Wellbutrin will follow up in 2 months she does not need a Xanax prescription as of now she can call in further refills there is no otherwise rash reported side effects.   Thresa Ross, M.D.  08/14/2016 3:38 PM

## 2016-08-23 ENCOUNTER — Ambulatory Visit (HOSPITAL_COMMUNITY): Payer: Self-pay | Admitting: Psychiatry

## 2016-08-25 ENCOUNTER — Other Ambulatory Visit (HOSPITAL_COMMUNITY): Payer: Self-pay | Admitting: Psychiatry

## 2016-08-27 NOTE — Telephone Encounter (Signed)
Received fax from [facility] requesting a refill for Lamictal. Per Dr. Gilmore LarocheAkhtar, refill is authorize for Lamictal 150mg , #180. Rx was sent to pharmacy. Pt's next apt is schedule on 09/24/16. Called and informed pt of refill status. Pt verbalizes understanding.

## 2016-09-24 ENCOUNTER — Ambulatory Visit (INDEPENDENT_AMBULATORY_CARE_PROVIDER_SITE_OTHER): Payer: BLUE CROSS/BLUE SHIELD | Admitting: Psychiatry

## 2016-09-24 ENCOUNTER — Encounter (HOSPITAL_COMMUNITY): Payer: Self-pay | Admitting: Psychiatry

## 2016-09-24 VITALS — BP 120/80 | HR 84 | Resp 16 | Ht 61.0 in | Wt 166.0 lb

## 2016-09-24 DIAGNOSIS — Z638 Other specified problems related to primary support group: Secondary | ICD-10-CM | POA: Diagnosis not present

## 2016-09-24 DIAGNOSIS — F411 Generalized anxiety disorder: Secondary | ICD-10-CM

## 2016-09-24 DIAGNOSIS — Z818 Family history of other mental and behavioral disorders: Secondary | ICD-10-CM

## 2016-09-24 DIAGNOSIS — Z79899 Other long term (current) drug therapy: Secondary | ICD-10-CM | POA: Diagnosis not present

## 2016-09-24 DIAGNOSIS — Z7951 Long term (current) use of inhaled steroids: Secondary | ICD-10-CM | POA: Diagnosis not present

## 2016-09-24 DIAGNOSIS — F3181 Bipolar II disorder: Secondary | ICD-10-CM | POA: Diagnosis not present

## 2016-09-24 MED ORDER — CITALOPRAM HYDROBROMIDE 40 MG PO TABS: ORAL_TABLET | ORAL | 0 refills | Status: DC

## 2016-09-24 MED ORDER — BUPROPION HCL ER (SR) 100 MG PO TB12: 100.0000 mg | ORAL_TABLET | Freq: Every day | ORAL | 0 refills | Status: DC

## 2016-09-24 NOTE — Progress Notes (Signed)
Patient ID: LICET DUNPHY, female   DOB: 1965-09-09, 51 y.o.   MRN: [id number]   [facility] Follow-up Outpatient Visit  KORRA CHRISTINE 1965-11-28 [id number] 51 y.o.  09/24/2016 3:52 PM  Chief Complaint:  HPI Comments: Mrs. Marquis is  a 51 y/o female with a past psychiatric history significant for Bipolar disorder. The patient returns for  medication management.   Patient is improved with Wellbutrin energy is improved less sleepiness. She is trying to focus on herself rather than her daughter's condition. Anxiety is manageable she is not taking Xanax on a regular basis is still looking for a job that is the main stress as of now  Depression: better No psychosis Aggravating factor: daughter drug use Modifying factor: her husband. Her grandkids   Past Medical Family, Social History:  Past Medical History:  Diagnosis Date  . Anxiety   . Depression    Family History  Problem Relation Age of Onset  . Diabetes Mother   . Anxiety disorder Mother   . Heart defect Mother   . Diabetes Father   . Schizophrenia Paternal Aunt   . Anxiety disorder Maternal Grandmother    Social History   Social History  . Marital status: Married    Spouse name: N/A  . Number of children: 4  . Years of education: N/A   Social History Main Topics  . Smoking status: Never Smoker  . Smokeless tobacco: Never Used  . Alcohol use No  . Drug use: No  . Sexual activity: Yes    Partners: Male   Other Topics Concern  . None   Social History Narrative   No regular exercise.     Outpatient Encounter Prescriptions as of 09/24/2016  Medication Sig  . ALPRAZolam (XANAX) 0.5 MG tablet Take 2 tablets (1 mg total) by mouth daily as needed for anxiety.  Marland Kitchen buPROPion (WELLBUTRIN SR) 100 MG 12 hr tablet Take 1 tablet (100 mg total) by mouth daily.  . citalopram (CELEXA) 40 MG tablet TAKE 1 TABLET (40 MG TOTAL) BY MOUTH DAILY. TAKE 1 TABLET DAILY  . lamoTRIgine (LAMICTAL) 150 MG tablet TAKE 1 TABLET  (150 MG TOTAL) BY MOUTH 2 (TWO) TIMES DAILY.  . norgestrel-ethinyl estradiol (CRYSELLE-28) 0.3-30 MG-MCG tablet Take 1 tablet by mouth daily.  Marland Kitchen PROVENTIL HFA 108 (90 BASE) MCG/ACT inhaler Inhale 2 puffs into the lungs every 4 (four) hours as needed for wheezing or shortness of breath.  . [DISCONTINUED] buPROPion (WELLBUTRIN SR) 100 MG 12 hr tablet Take 1 tablet (100 mg total) by mouth daily.  . [DISCONTINUED] citalopram (CELEXA) 40 MG tablet TAKE 1 TABLET (40 MG TOTAL) BY MOUTH DAILY. TAKE 1 TABLET DAILY  . [DISCONTINUED] lamoTRIgine (LAMICTAL) 150 MG tablet TAKE 1 TABLET (150 MG TOTAL) BY MOUTH 2 (TWO) TIMES DAILY.   No facility-administered encounter medications on file as of 09/24/2016.      Review of Systems  Constitutional: Negative for fever.  Cardiovascular: Negative for palpitations.  Skin: Negative for rash.  Neurological: Negative for tingling.  Psychiatric/Behavioral: Negative for substance abuse. The patient does not have insomnia.      Physical Exam: Constitutional:  BP 120/80 (BP Location: Right Arm, Patient Position: Sitting, Cuff Size: Normal)   Pulse 84   Resp 16   Ht 5\' 1"  (1.549 m)   Wt 166 lb (75.3 kg)   SpO2 96%   BMI 31.37 kg/m   General Appearance: alert, oriented, no acute distress   Psychiatric Speciality Exam: General Appearance: Casual  and Well Groomed  Eye Contact::  Good  Speech:  Clear and Coherent and Normal Rate  Volume:  Normal  Euthymic mood  Affect improved  Thought Process:  Coherent, Linear and Logical  Orientation:  Full (Time, Place, and Person)  Thought Content:  WDL  Suicidal Thoughts:  No  Homicidal Thoughts:  No  Memory:  Immediate;   Good Recent;   Good Remote;   Good  Judgement:  Good  Insight:  Fair  Psychomotor Activity:  Normal  Concentration:  Good  Recall:  Good  Akathisia:  No  Handed:  Right  AIMS (if indicated):     Assets:  Intimacy Resilience Social Support  Language-intact  Fund of knowledge is good      Assessment: Axis I: Bipolar II Disorder- depressed. GAD. Relationship concerns    Plan:  Plan of Care:  PLAN:  1. Affirm with the patient that the medications are taken as ordered. Patient  expressed understanding of how their medications were to be used.    Laboratory: None   Psychotherapy: Therapy: brief supportive therapy provided.  Discussed psychosocial stressors in detail. More than 50% of the visit was spent on individual therapy/counseling. . Provided supportive therapy in regard to her grief and issues with her daughter  Medications:  Continue  the following psychiatric medications  Bipolar: depression :improving. Continue wellbutrin, celexa and lamictal Anxiety: not worse. Continue celexa. Prn xanax Fatigue: better         Consultations: The patient was encouraged to keep all PCP and specialty clinic appointments.      Renewed wellbutrin, celexa. Call for lamictal once needed FU 3 months   Thresa RossNADEEM Jaionna Weisse, M.D.  09/24/2016 3:52 PM

## 2016-11-11 ENCOUNTER — Other Ambulatory Visit (HOSPITAL_COMMUNITY): Payer: Self-pay | Admitting: Psychiatry

## 2016-11-12 NOTE — Telephone Encounter (Signed)
Medication refill- received fax from [facility] requesting a refill for Wellbutrin. Per Dr. Gilmore LarocheAkhtar, refill is authorize for Wellbutrin 100mg , #30. Rx was sent to pharmacy. Pt's next apt is schedule on 12/18/16. Lvm informing pt of refill status.

## 2016-12-10 ENCOUNTER — Other Ambulatory Visit (HOSPITAL_COMMUNITY): Payer: Self-pay | Admitting: Psychiatry

## 2016-12-13 ENCOUNTER — Telehealth (HOSPITAL_COMMUNITY): Payer: Self-pay | Admitting: Psychiatry

## 2016-12-13 NOTE — Telephone Encounter (Signed)
Can refill. 

## 2016-12-13 NOTE — Telephone Encounter (Signed)
Pt needs refill on lamotrigine, citalopram, and bupropion  [facility]

## 2016-12-13 NOTE — Telephone Encounter (Signed)
Per Dr. Gilmore Laroche, rx's [facility]. Lvm informing pt of refill status and apt. Nothing further is need at this time.

## 2016-12-13 NOTE — Telephone Encounter (Signed)
Medication refill- received fax from [facility] requesting refills for Lamictal, Celexa and Wellbutrin. Per Dr. Gilmore Laroche, refills are authorize for Lamictal 150mg , #180, Celexa 40mg , #90, and Wellbutrin 100mg , #30. Rx's were sent to pharmacy. Lvm informing pt of refill status. Pt's next apt is schedule on 12/18/16. Nothing further is need at this time.

## 2016-12-18 ENCOUNTER — Ambulatory Visit (INDEPENDENT_AMBULATORY_CARE_PROVIDER_SITE_OTHER): Payer: BLUE CROSS/BLUE SHIELD | Admitting: Psychiatry

## 2016-12-18 ENCOUNTER — Encounter (HOSPITAL_COMMUNITY): Payer: Self-pay | Admitting: Psychiatry

## 2016-12-18 VITALS — BP 114/72 | HR 94 | Resp 16 | Ht 61.0 in | Wt 163.0 lb

## 2016-12-18 DIAGNOSIS — Z639 Problem related to primary support group, unspecified: Secondary | ICD-10-CM

## 2016-12-18 DIAGNOSIS — Z6379 Other stressful life events affecting family and household: Secondary | ICD-10-CM | POA: Diagnosis not present

## 2016-12-18 DIAGNOSIS — Z630 Problems in relationship with spouse or partner: Secondary | ICD-10-CM

## 2016-12-18 DIAGNOSIS — F3181 Bipolar II disorder: Secondary | ICD-10-CM | POA: Diagnosis not present

## 2016-12-18 DIAGNOSIS — Z818 Family history of other mental and behavioral disorders: Secondary | ICD-10-CM | POA: Diagnosis not present

## 2016-12-18 DIAGNOSIS — F411 Generalized anxiety disorder: Secondary | ICD-10-CM

## 2016-12-18 MED ORDER — BUPROPION HCL ER (SR) 100 MG PO TB12: 100.0000 mg | ORAL_TABLET | Freq: Every day | ORAL | 1 refills | Status: DC

## 2016-12-18 NOTE — Progress Notes (Signed)
Patient ID: Cynthia Barrett, female   DOB: February 01, 1966, 51 y.o.   MRN: [id number]   [facility] Follow-up Outpatient Visit  Cynthia Barrett 1965-11-23 [id number] 50 y.o.  12/18/2016 4:41 PM  Chief Complaint:  HPI Comments: Cynthia Barrett is  a 51 y/o female with a past psychiatric history significant for Bipolar disorder. The patient returns for  medication management.   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 She does not worry too much about her daughter's condition. Does get some fatigue at times energy level otherwise is not worse depression is improving aggravating factors is the daughter's condition at times modifying factor her husband her grandkids    No psychosis.  Gets some forgetfull at times.    Past Medical Family, Social History:  Past Medical History:  Diagnosis Date  . Anxiety   . Depression    Family History  Problem Relation Age of Onset  . Diabetes Mother   . Anxiety disorder Mother   . Heart defect Mother   . Diabetes Father   . Schizophrenia Paternal Aunt   . Anxiety disorder Maternal Grandmother    Social History   Social History  . Marital status: Married    Spouse name: N/A  . Number of children: 4  . Years of education: N/A   Social History Main Topics  . Smoking status: Never Smoker  . Smokeless tobacco: Never Used  . Alcohol use No  . Drug use: No  . Sexual activity: Yes    Partners: Male   Other Topics Concern  . None   Social History Narrative   No regular exercise.     Outpatient Encounter Prescriptions as of 12/18/2016  Medication Sig  . ALPRAZolam (XANAX) 0.5 MG tablet Take 2 tablets (1 mg total) by mouth daily as needed for anxiety.  Marland Kitchen buPROPion (WELLBUTRIN SR) 100 MG 12 hr tablet Take 1 tablet (100 mg total) by mouth daily. Refill when due  . citalopram (CELEXA) 40 MG  tablet TAKE 1 TABLET (40 MG TOTAL) BY MOUTH DAILY. TAKE 1 TABLET DAILY  . lamoTRIgine (LAMICTAL) 150 MG tablet TAKE 1 TABLET (150 MG TOTAL) BY MOUTH 2 (TWO) TIMES DAILY.  . norgestrel-ethinyl estradiol (CRYSELLE-28) 0.3-30 MG-MCG tablet Take 1 tablet by mouth daily.  Marland Kitchen PROVENTIL HFA 108 (90 BASE) MCG/ACT inhaler Inhale 2 puffs into the lungs every 4 (four) hours as needed for wheezing or shortness of breath.  . [DISCONTINUED] buPROPion (WELLBUTRIN SR) 100 MG 12 hr tablet TAKE 1 TABLET BY MOUTH EVERY DAY   No facility-administered encounter medications on file as of 12/18/2016.      Review of Systems  Constitutional: Negative for fever.  Cardiovascular: Negative for palpitations.  Skin: Negative for rash.  Neurological: Negative for tingling.  Psychiatric/Behavioral: Negative for depression and substance abuse. The patient does not have insomnia.      Physical Exam: Constitutional:  BP 114/72 (BP Location: Right Arm, Patient Position: Sitting, Cuff Size: Normal)   Pulse 94   Resp 16   Ht 5\' 1"  (1.549 m)   Wt 163 lb (73.9 kg)   SpO2 94%   BMI 30.80 kg/m   General Appearance: alert, oriented, no acute distress   Psychiatric Speciality Exam: General Appearance: Casual and Well Groomed  Eye Contact::  Good  Speech:  Clear and Coherent  and Normal Rate  Volume:  Normal  Mood euthymic  Affect congruent  Thought Process:  Coherent, Linear and Logical  Orientation:  Full (Time, Place, and Person)  Thought Content:  WDL  Suicidal Thoughts:  No  Homicidal Thoughts:  No  Memory:  Immediate;   Good Recent;   Good Remote;   Good  Judgement:  Good  Insight:  Fair  Psychomotor Activity:  Normal  Concentration:  Good  Recall:  Good  Akathisia:  No  Handed:  Right  AIMS (if indicated):     Assets:  Intimacy Resilience Social Support  Language-intact  Fund of knowledge is good     Assessment: Axis I: Bipolar II Disorder- depressed. GAD. Relationship  concerns    Plan:  Plan of Care:  PLAN:  1. Affirm with the patient that the medications are taken as ordered. Patient  expressed understanding of how their medications were to be used.    Laboratory: None   Psychotherapy: Therapy: brief supportive therapy provided.  Discussed psychosocial stressors in detail. More than 50% of the visit was spent on individual therapy/counseling. . Provided supportive therapy in regard to her grief and issues with her daughter  Medications:  Continue  the following psychiatric medications  Bipolar: depression. Improving. Continue meds. Continue wellbutrin. lamictal Anxiety:baseline. Continue meds. Xanax prn, continue celexa Fatigue: improved         Consultations: The patient was encouraged to keep all PCP and specialty clinic appointments.      Reviewed balance discussed side effects. Follow up with neurology or primary care in case she becomes more forgetful but advised to keep memo cards and also to have adequate sleep hours and basic lab work or as requested after visiting primary care.    Thresa Ross, M.D.  12/18/2016 4:41 PM

## 2017-01-01 ENCOUNTER — Ambulatory Visit (INDEPENDENT_AMBULATORY_CARE_PROVIDER_SITE_OTHER): Payer: BLUE CROSS/BLUE SHIELD | Admitting: Psychiatry

## 2017-01-01 ENCOUNTER — Encounter (HOSPITAL_COMMUNITY): Payer: Self-pay | Admitting: Psychiatry

## 2017-01-01 VITALS — BP 128/76 | HR 95 | Resp 18 | Ht 61.0 in | Wt 165.0 lb

## 2017-01-01 DIAGNOSIS — R450 Nervousness: Secondary | ICD-10-CM

## 2017-01-01 DIAGNOSIS — Z818 Family history of other mental and behavioral disorders: Secondary | ICD-10-CM

## 2017-01-01 DIAGNOSIS — F411 Generalized anxiety disorder: Secondary | ICD-10-CM | POA: Diagnosis not present

## 2017-01-01 DIAGNOSIS — F410 Panic disorder [episodic paroxysmal anxiety] without agoraphobia: Secondary | ICD-10-CM | POA: Diagnosis not present

## 2017-01-01 DIAGNOSIS — Z639 Problem related to primary support group, unspecified: Secondary | ICD-10-CM

## 2017-01-01 DIAGNOSIS — F3181 Bipolar II disorder: Secondary | ICD-10-CM | POA: Diagnosis not present

## 2017-01-01 MED ORDER — ALPRAZOLAM 0.5 MG PO TABS: 0.5000 mg | ORAL_TABLET | Freq: Two times a day (BID) | ORAL | 0 refills | Status: DC | PRN

## 2017-01-01 NOTE — Progress Notes (Signed)
Patient ID: TEMPLE EWART, female   DOB: Oct 11, 1965, 51 y.o.   MRN: [id number]   [facility] Follow-up Outpatient Visit  JANESHA BRISSETTE Jun 15, 1965 [id number] 51 y.o.  01/01/2017 9:28 AM  Chief Complaint:  HPI Comments: Mrs. Hildebrandt is  a 51 y/o female with a past psychiatric history significant for Bipolar disorder. The patient returns for  medication management.   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 Her trainer also somewhat poorer down and that made her more embarrassed and she continued to have anxiety panic-like symptoms where she is coming back for management  Said xanax helped some but continued to have anxiety Apprehension of going to work Anxiety while driving next day to work.  Depressed due to above. Prior to this incident she was doing balanced on medications.   No psychosis.  Gets some forgetfull at times.    Past Medical Family, Social History:  Past Medical History:  Diagnosis Date  . Anxiety   . Depression    Family History  Problem Relation Age of Onset  . Diabetes Mother   . Anxiety disorder Mother   . Heart defect Mother   . Diabetes Father   . Schizophrenia Paternal Aunt   . Anxiety disorder Maternal Grandmother    Social History   Social History  . Marital status: Married    Spouse name: N/A  . Number of children: 4  . Years of education: N/A   Social History Main Topics  . Smoking status: Never Smoker  . Smokeless tobacco: Never Used  . Alcohol use No  . Drug use: No  . Sexual activity: Yes    Partners: Male   Other Topics Concern  . None   Social  History Narrative   No regular exercise.     Outpatient Encounter Prescriptions as of 01/01/2017  Medication Sig  . ALPRAZolam (XANAX) 0.5 MG tablet Take 1 tablet (0.5 mg total) by mouth 2 (two) times daily as needed for anxiety.  Marland Kitchen buPROPion (WELLBUTRIN SR) 100 MG 12 hr tablet Take 1 tablet (100 mg total) by mouth daily. Refill when due  . citalopram (CELEXA) 40 MG tablet TAKE 1 TABLET (40 MG TOTAL) BY MOUTH DAILY. TAKE 1 TABLET DAILY  . lamoTRIgine (LAMICTAL) 150 MG tablet TAKE 1 TABLET (150 MG TOTAL) BY MOUTH 2 (TWO) TIMES DAILY.  . norgestrel-ethinyl estradiol (CRYSELLE-28) 0.3-30 MG-MCG tablet Take 1 tablet by mouth daily.  Marland Kitchen PROVENTIL HFA 108 (90 BASE) MCG/ACT inhaler Inhale 2 puffs into the lungs every 4 (four) hours as needed for wheezing or shortness of breath.  . [DISCONTINUED] ALPRAZolam (XANAX) 0.5 MG tablet Take 2 tablets (1 mg total) by mouth daily as needed for anxiety.   No facility-administered encounter medications on file as of 01/01/2017.      Review of Systems  Constitutional: Negative for fever.  Cardiovascular: Negative for chest pain.  Skin: Negative for rash.  Neurological: Negative for tingling.  Psychiatric/Behavioral: Positive for depression. Negative for substance abuse. The patient is nervous/anxious. The patient does not have insomnia.      Physical  Exam: Constitutional:  BP 128/76 (BP Location: Right Arm, Patient Position: Sitting, Cuff Size: Normal)   Pulse 95   Resp 18   Ht 5\' 1"  (1.549 m)   Wt 165 lb (74.8 kg)   SpO2 95%   BMI 31.18 kg/m   General Appearance: alert, oriented, no acute distress   Psychiatric Speciality Exam: General Appearance: Casual and Well Groomed  Eye Contact::  Good  Speech:  Clear and Coherent and Normal Rate  Volume:  Normal  Mood anxious, dysphoric, panicky related to work  Affect congruent  Thought Process:  Coherent, Linear and Logical  Orientation:  Full (Time, Place, and Person)  Thought Content:  WDL   Suicidal Thoughts:  No  Homicidal Thoughts:  No  Memory:  Immediate;   Good Recent;   Good Remote;   Good  Judgement:  Good  Insight:  Fair  Psychomotor Activity:  Normal  Concentration:  Good  Recall:  Good  Akathisia:  No  Handed:  Right  AIMS (if indicated):     Assets:  Intimacy Resilience Social Support  Language-intact  Fund of knowledge is good     Assessment: Axis I: Bipolar II Disorder- depressed. GAD. Relationship concerns. Panic attacks    Plan:  Plan of Care:  PLAN:  1. Affirm with the patient that the medications are taken as ordered. Patient  expressed understanding of how their medications were to be used.    Laboratory: None   Psychotherapy: Therapy: brief supportive therapy provided.  Discussed psychosocial stressors in detail. More than 50% of the visit was spent on individual therapy/counseling. . Provided supportive therapy in regard to her grief and issues with her daughter  Medications:  Continue  the following psychiatric medications  Bipolar: depression.stressed due to current incident. Will continue mood stabilizer lamictal Anxiety:: worse. Including panic atttacks. Continue celexa increase xanax 0.5mg  bid prn for performance as well Fatigue: baseline         Consultations: The patient was encouraged to keep all PCP and specialty clinic appointments.      Reviewed balance discussed side effects. Provided supportive therapy. Breathing techniques . 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  Carry xanax with herself for now and increase to bid prn FU 3weeks or earlier if needed   Thresa RossNADEEM Madell Heino, M.D.  01/01/2017 9:28 AM

## 2017-01-24 ENCOUNTER — Encounter (HOSPITAL_COMMUNITY): Payer: Self-pay | Admitting: Psychiatry

## 2017-01-24 ENCOUNTER — Ambulatory Visit (INDEPENDENT_AMBULATORY_CARE_PROVIDER_SITE_OTHER): Payer: BLUE CROSS/BLUE SHIELD | Admitting: Psychiatry

## 2017-01-24 DIAGNOSIS — F411 Generalized anxiety disorder: Secondary | ICD-10-CM

## 2017-01-24 DIAGNOSIS — Z818 Family history of other mental and behavioral disorders: Secondary | ICD-10-CM

## 2017-01-24 DIAGNOSIS — F418 Other specified anxiety disorders: Secondary | ICD-10-CM

## 2017-01-24 DIAGNOSIS — F3181 Bipolar II disorder: Secondary | ICD-10-CM

## 2017-01-24 DIAGNOSIS — F410 Panic disorder [episodic paroxysmal anxiety] without agoraphobia: Secondary | ICD-10-CM | POA: Diagnosis not present

## 2017-01-24 DIAGNOSIS — Z569 Unspecified problems related to employment: Secondary | ICD-10-CM

## 2017-01-24 DIAGNOSIS — Z79899 Other long term (current) drug therapy: Secondary | ICD-10-CM | POA: Diagnosis not present

## 2017-01-24 DIAGNOSIS — Z639 Problem related to primary support group, unspecified: Secondary | ICD-10-CM | POA: Diagnosis not present

## 2017-01-24 NOTE — Progress Notes (Signed)
Patient ID: Cynthia Barrett, female   DOB: 06-05-1965, 51 y.o.   MRN: [id number]   [facility] Follow-up Outpatient Visit  Cynthia Barrett 18-Sep-1965 [id number] 51 y.o.  01/24/2017 8:53 AM  Chief Complaint:  HPI Comments: Cynthia Barrett is  a 51 y/o female with a past psychiatric history significant for Bipolar disorder. The patient returns for  medication management.   Last visit was an urgent visit because of having panic attacks as review job. I increased her Xanax and also medications were adjusted apparently she did reasonable less panic he is able to control her management of anxiety and her job she still not started doing the IV blood take. But is working at [employer] center  Xanax has helped, now she is not taking bid Apprehension of going to work has improved  Depression : beter  worry about her daughter who is pregnant again  No psychosis   Past Medical Family, Social History:  Past Medical History:  Diagnosis Date  . Anxiety   . Depression    Family History  Problem Relation Age of Onset  . Diabetes Mother   . Anxiety disorder Mother   . Heart defect Mother   . Diabetes Father   . Schizophrenia Paternal Aunt   . Anxiety disorder Maternal Grandmother    Social History   Social History  . Marital status: Married    Spouse name: N/A  . Number of children: 4  . Years of education: N/A   Social History Main Topics  . Smoking status: Never Smoker  . Smokeless tobacco: Never Used  . Alcohol use No  . Drug use: No  . Sexual activity: Yes    Partners: Male   Other Topics Concern  . None   Social History Narrative   No regular exercise.     Outpatient Encounter Prescriptions as of 01/24/2017  Medication Sig  . ALPRAZolam (XANAX) 0.5 MG tablet Take 1 tablet (0.5 mg total) by mouth 2 (two) times daily as needed for anxiety.  Marland Kitchen buPROPion (WELLBUTRIN SR) 100 MG 12 hr tablet Take 1 tablet (100 mg total) by mouth daily. Refill when due  . citalopram  (CELEXA) 40 MG tablet TAKE 1 TABLET (40 MG TOTAL) BY MOUTH DAILY. TAKE 1 TABLET DAILY  . lamoTRIgine (LAMICTAL) 150 MG tablet TAKE 1 TABLET (150 MG TOTAL) BY MOUTH 2 (TWO) TIMES DAILY.  . norgestrel-ethinyl estradiol (CRYSELLE-28) 0.3-30 MG-MCG tablet Take 1 tablet by mouth daily.  Marland Kitchen PROVENTIL HFA 108 (90 BASE) MCG/ACT inhaler Inhale 2 puffs into the lungs every 4 (four) hours as needed for wheezing or shortness of breath.   No facility-administered encounter medications on file as of 01/24/2017.      Review of Systems  Constitutional: Negative for fever.  Cardiovascular: Negative for palpitations.  Skin: Negative for rash.  Neurological: Negative for tingling.  Psychiatric/Behavioral: Negative for substance abuse. The patient does not have insomnia.      Physical Exam: Constitutional:  There were no vitals taken for this visit.  General Appearance: alert, oriented, no acute distress   Psychiatric Speciality Exam: General Appearance: Casual and Well Groomed  Eye Contact::  Good  Speech:  Clear and Coherent and Normal Rate  Volume:  Normal  Mood improved  Affect recactive  Thought Process:  Coherent, Linear and Logical  Orientation:  Full (Time, Place, and Person)  Thought Content:  WDL  Suicidal Thoughts:  No  Homicidal Thoughts:  No  Memory:  Immediate;   Good Recent;  Good Remote;   Good  Judgement:  Good  Insight:  Fair  Psychomotor Activity:  Normal  Concentration:  Good  Recall:  Good  Akathisia:  No  Handed:  Right  AIMS (if indicated):     Assets:  Intimacy Resilience Social Support  Language-intact  Fund of knowledge is good     Assessment: Axis I: Bipolar II Disorder- depressed. GAD. Relationship concerns. Panic attacks    Plan:  Plan of Care:  PLAN:  1. Affirm with the patient that the medications are taken as ordered. Patient  expressed understanding of how their medications were to be used.    Laboratory: None   Psychotherapy: Therapy:  brief supportive therapy provided.  Discussed psychosocial stressors in detail. More than 50% of the visit was spent on individual therapy/counseling. . Provided supportive therapy in regard to her grief and issues with her daughter  Medications:  Continue  the following psychiatric medications  Bipolar: depression.less stressed, continue lamicxtal Anxiety::improved. Continue celexa and low dose xanax  Fatigue: not worse          Consultations: The patient was encouraged to keep all PCP and specialty clinic appointments.      She carries xanax for prn anxiety attacks Provided supportive therapy discussed in review side effects and concerns follow-up in 2 months she has prescriptions for now    Thresa Ross, M.D.  01/24/2017 8:53 AM

## 2017-03-08 ENCOUNTER — Other Ambulatory Visit (HOSPITAL_COMMUNITY): Payer: Self-pay | Admitting: Psychiatry

## 2017-03-11 ENCOUNTER — Ambulatory Visit (HOSPITAL_COMMUNITY): Payer: Self-pay | Admitting: Psychiatry

## 2017-03-13 ENCOUNTER — Ambulatory Visit (HOSPITAL_COMMUNITY): Payer: Self-pay | Admitting: Psychiatry

## 2017-03-21 ENCOUNTER — Encounter (HOSPITAL_COMMUNITY): Payer: Self-pay | Admitting: Psychiatry

## 2017-03-21 ENCOUNTER — Ambulatory Visit (INDEPENDENT_AMBULATORY_CARE_PROVIDER_SITE_OTHER): Payer: BLUE CROSS/BLUE SHIELD | Admitting: Psychiatry

## 2017-03-21 ENCOUNTER — Other Ambulatory Visit: Payer: Self-pay

## 2017-03-21 VITALS — BP 106/80 | HR 93 | Ht 61.0 in | Wt 165.0 lb

## 2017-03-21 DIAGNOSIS — F418 Other specified anxiety disorders: Secondary | ICD-10-CM

## 2017-03-21 DIAGNOSIS — F411 Generalized anxiety disorder: Secondary | ICD-10-CM

## 2017-03-21 DIAGNOSIS — F3181 Bipolar II disorder: Secondary | ICD-10-CM

## 2017-03-21 MED ORDER — BUPROPION HCL ER (SR) 100 MG PO TB12: 100.0000 mg | ORAL_TABLET | Freq: Every day | ORAL | 2 refills | Status: DC

## 2017-03-21 MED ORDER — ALPRAZOLAM 0.5 MG PO TABS: 0.5000 mg | ORAL_TABLET | Freq: Two times a day (BID) | ORAL | 0 refills | Status: DC | PRN

## 2017-03-21 MED ORDER — LAMOTRIGINE 150 MG PO TABS: ORAL_TABLET | ORAL | 0 refills | Status: DC

## 2017-03-21 MED ORDER — CITALOPRAM HYDROBROMIDE 40 MG PO TABS: ORAL_TABLET | ORAL | 0 refills | Status: DC

## 2017-03-21 NOTE — Progress Notes (Signed)
Patient ID: Cynthia Barrett, female   DOB: 01/25/1966, 51 y.o.   MRN: [id number]   [facility]  Cynthia Barrett 12/26/1965 [id number] 51 y.o.  03/21/2017 9:05 AM  Chief Complaint:  HPI Comments: Cynthia Barrett is  a 51 y/o female with a past psychiatric history significant for Bipolar disorder. The patient returns for  medication management.   Panic attacks are in control. She is comfortable with her job. Not taking xanax so will not give refill Daughter is pregnant. That's a stress  Depression better Sleep, energy fair Stress level is low now   No psychosis   Past Medical Family, Social History:  Past Medical History:  Diagnosis Date  . Anxiety   . Depression    Family History  Problem Relation Age of Onset  . Diabetes Mother   . Anxiety disorder Mother   . Heart defect Mother   . Diabetes Father   . Schizophrenia Paternal Aunt   . Anxiety disorder Maternal Grandmother    Social History   Socioeconomic History  . Marital status: Married    Spouse name: None  . Number of children: 4  . Years of education: None  . Highest education level: None  Social Needs  . Financial resource strain: None  . Food insecurity - worry: None  . Food insecurity - inability: None  . Transportation needs - medical: None  . Transportation needs - non-medical: None  Occupational History  . None  Tobacco Use  . Smoking status: Never Smoker  . Smokeless tobacco: Never Used  Substance and Sexual Activity  . Alcohol use: No  . Drug use: No  . Sexual activity: Yes    Partners: Male  Other Topics Concern  . None  Social History Narrative   No regular exercise.     Outpatient Encounter Medications as of 03/21/2017  Medication Sig  . ALPRAZolam (XANAX) 0.5 MG tablet Take 1 tablet (0.5 mg total) by mouth 2 (two) times daily as needed for anxiety.  Marland Kitchen. buPROPion (WELLBUTRIN SR) 100 MG 12 hr tablet Take 1 tablet (100 mg total) by mouth daily. Refill  when due  . citalopram (CELEXA) 40 MG tablet TAKE 1 TABLET (40 MG TOTAL) BY MOUTH DAILY. TAKE 1 TABLET DAILY  . lamoTRIgine (LAMICTAL) 150 MG tablet TAKE 1 TABLET (150 MG TOTAL) BY MOUTH 2 (TWO) TIMES DAILY.  Marland Kitchen. PROVENTIL HFA 108 (90 BASE) MCG/ACT inhaler Inhale 2 puffs into the lungs every 4 (four) hours as needed for wheezing or shortness of breath.  . [DISCONTINUED] ALPRAZolam (XANAX) 0.5 MG tablet Take 1 tablet (0.5 mg total) by mouth 2 (two) times daily as needed for anxiety.  . [DISCONTINUED] buPROPion (WELLBUTRIN SR) 100 MG 12 hr tablet Take 1 tablet (100 mg total) by mouth daily. Refill when due  . [DISCONTINUED] citalopram (CELEXA) 40 MG tablet TAKE 1 TABLET (40 MG TOTAL) BY MOUTH DAILY. TAKE 1 TABLET DAILY  . [DISCONTINUED] lamoTRIgine (LAMICTAL) 150 MG tablet TAKE 1 TABLET (150 MG TOTAL) BY MOUTH 2 (TWO) TIMES DAILY.  . norgestrel-ethinyl estradiol (CRYSELLE-28) 0.3-30 MG-MCG tablet Take 1 tablet by mouth daily. (Patient not taking: Reported on 03/21/2017)   No facility-administered encounter medications on file as of 03/21/2017.      Review of Systems  Constitutional: Negative for fever.  Cardiovascular: Negative for chest pain.  Skin: Negative for rash.  Neurological: Negative for tingling.  Psychiatric/Behavioral: Negative for depression and substance abuse. The patient does not have insomnia.  Physical Exam: Constitutional:  BP 106/80 (BP Location: Left Arm, Patient Position: Sitting, Cuff Size: Normal)   Pulse 93   Ht 5\' 1"  (1.549 m)   Wt 165 lb (74.8 kg)   BMI 31.18 kg/m   General Appearance: alert, oriented, no acute distress   Psychiatric Speciality Exam: General Appearance: Casual and Well Groomed  Eye Contact::  Good  Speech:  Clear and Coherent and Normal Rate  Volume:  Normal  Mood good  Affect recactive pleasant  Thought Process:  Coherent, Linear and Logical  Orientation:  Full (Time, Place, and Person)  Thought Content:  WDL  Suicidal  Thoughts:  No  Homicidal Thoughts:  No  Memory:  Immediate;   Good Recent;   Good Remote;   Good  Judgement:  Good  Insight:  Fair  Psychomotor Activity:  Normal  Concentration:  Good  Recall:  Good  Akathisia:  No  Handed:  Right  AIMS (if indicated):     Assets:  Intimacy Resilience Social Support  Language-intact  Fund of knowledge is good     Assessment: Axis I: Bipolar II Disorder- depressed. GAD. Relationship concerns. Panic attacks    Plan:  Plan of Care:  PLAN:  1. Affirm with the patient that the medications are taken as ordered. Patient  expressed understanding of how their medications were to be used.    Laboratory: None   Psychotherapy: Therapy: brief supportive therapy provided.  Discussed psychosocial stressors in detail. More than 50% of the visit was spent on individual therapy/counseling. . Provided supportive therapy in regard to her grief and issues with her daughter  Medications:  Continue  the following psychiatric medications  Bipolar: depression.improved. Continue lamictal Low dose wellbutrin for depression Anxiety::better. Continue celexa  Fatigue: not worse. Continue wellbutrin 3 months prescriptions given. Except for xanax          Consultations: The patient was encouraged to keep all PCP and specialty clinic appointments.         Thresa RossNADEEM Ezma Rehm, M.D.  03/21/2017 9:05 AM

## 2017-04-02 ENCOUNTER — Other Ambulatory Visit (HOSPITAL_COMMUNITY): Payer: Self-pay

## 2017-04-02 MED ORDER — BUPROPION HCL ER (SR) 100 MG PO TB12: 100.0000 mg | ORAL_TABLET | Freq: Every day | ORAL | 0 refills | Status: DC

## 2017-04-30 ENCOUNTER — Encounter: Payer: Self-pay | Admitting: Family Medicine

## 2017-04-30 DIAGNOSIS — Z0189 Encounter for other specified special examinations: Secondary | ICD-10-CM

## 2017-06-11 ENCOUNTER — Ambulatory Visit (INDEPENDENT_AMBULATORY_CARE_PROVIDER_SITE_OTHER): Payer: BLUE CROSS/BLUE SHIELD | Admitting: Psychiatry

## 2017-06-11 ENCOUNTER — Encounter (HOSPITAL_COMMUNITY): Payer: Self-pay | Admitting: Psychiatry

## 2017-06-11 ENCOUNTER — Other Ambulatory Visit: Payer: Self-pay

## 2017-06-11 VITALS — BP 106/70 | HR 94 | Ht 61.0 in | Wt 166.0 lb

## 2017-06-11 DIAGNOSIS — F418 Other specified anxiety disorders: Secondary | ICD-10-CM

## 2017-06-11 DIAGNOSIS — F411 Generalized anxiety disorder: Secondary | ICD-10-CM | POA: Diagnosis not present

## 2017-06-11 DIAGNOSIS — Z6379 Other stressful life events affecting family and household: Secondary | ICD-10-CM | POA: Diagnosis not present

## 2017-06-11 DIAGNOSIS — F410 Panic disorder [episodic paroxysmal anxiety] without agoraphobia: Secondary | ICD-10-CM

## 2017-06-11 DIAGNOSIS — Z818 Family history of other mental and behavioral disorders: Secondary | ICD-10-CM | POA: Diagnosis not present

## 2017-06-11 DIAGNOSIS — F3181 Bipolar II disorder: Secondary | ICD-10-CM | POA: Diagnosis not present

## 2017-06-11 MED ORDER — BUPROPION HCL ER (SR) 100 MG PO TB12: 100.0000 mg | ORAL_TABLET | Freq: Every day | ORAL | 0 refills | Status: DC

## 2017-06-11 MED ORDER — CITALOPRAM HYDROBROMIDE 40 MG PO TABS: ORAL_TABLET | ORAL | 0 refills | Status: DC

## 2017-06-11 MED ORDER — LAMOTRIGINE 150 MG PO TABS: ORAL_TABLET | ORAL | 0 refills | Status: DC

## 2017-06-11 NOTE — Progress Notes (Signed)
Patient ID: Cynthia Barrett, female   DOB: 1965/11/05, 52 y.o.   MRN: [id number]   [facility] Follow-up Outpatient Visit  Cynthia Barrett 1965-12-29 [id number] 52 y.o.  06/11/2017 8:51 AM  Chief Complaint:  HPI Comments: [name] is  a 52 y/o female with a past psychiatric history significant for Bipolar disorder. The patient returns for  medication management.   Panic attacks are in contorl. Likes her job  gave car to daughter. Still worries about her and she is pregnant  Modifying factor: now working Duration of depression more then 10 years Timing when daughter stresses her out Depression better Sleep, fair Stress level is low now   No psychosis   Past Medical Family, Social History:  Past Medical History:  Diagnosis Date  . Anxiety   . Depression    Family History  Problem Relation Age of Onset  . Diabetes Mother   . Anxiety disorder Mother   . Heart defect Mother   . Diabetes Father   . Schizophrenia Paternal Aunt   . Anxiety disorder Maternal Grandmother    Social History   Socioeconomic History  . Marital status: Married    Spouse name: None  . Number of children: 4  . Years of education: None  . Highest education level: None  Social Needs  . Financial resource strain: None  . Food insecurity - worry: None  . Food insecurity - inability: None  . Transportation needs - medical: None  . Transportation needs - non-medical: None  Occupational History  . None  Tobacco Use  . Smoking status: Never Smoker  . Smokeless tobacco: Never Used  Substance and Sexual Activity  . Alcohol use: No  . Drug use: No  . Sexual activity: Yes    Partners: Male  Other Topics Concern  . None  Social History Narrative   No regular exercise.     Outpatient Encounter Medications as of 06/11/2017  Medication Sig  . ALPRAZolam (XANAX) 0.5 MG tablet Take 1 tablet (0.5 mg total) by mouth 2 (two) times daily as needed for anxiety.  Marland Kitchen buPROPion (WELLBUTRIN SR)  100 MG 12 hr tablet Take 1 tablet (100 mg total) by mouth daily. Refill when due  . citalopram (CELEXA) 40 MG tablet TAKE 1 TABLET (40 MG TOTAL) BY MOUTH DAILY. TAKE 1 TABLET DAILY  . lamoTRIgine (LAMICTAL) 150 MG tablet TAKE 1 TABLET (150 MG TOTAL) BY MOUTH 2 (TWO) TIMES DAILY.  . [DISCONTINUED] buPROPion (WELLBUTRIN SR) 100 MG 12 hr tablet Take 1 tablet (100 mg total) by mouth daily. Refill when due  . [DISCONTINUED] citalopram (CELEXA) 40 MG tablet TAKE 1 TABLET (40 MG TOTAL) BY MOUTH DAILY. TAKE 1 TABLET DAILY  . [DISCONTINUED] lamoTRIgine (LAMICTAL) 150 MG tablet TAKE 1 TABLET (150 MG TOTAL) BY MOUTH 2 (TWO) TIMES DAILY.  . norgestrel-ethinyl estradiol (CRYSELLE-28) 0.3-30 MG-MCG tablet Take 1 tablet by mouth daily. (Patient not taking: Reported on 03/21/2017)  . PROVENTIL HFA 108 (90 BASE) MCG/ACT inhaler Inhale 2 puffs into the lungs every 4 (four) hours as needed for wheezing or shortness of breath. (Patient not taking: Reported on 06/11/2017)  . [DISCONTINUED] propranolol (INDERAL) 10 MG tablet TAKE 1 TAB BY MOUTH DAILY  . [DISCONTINUED] QUEtiapine (SEROQUEL) 100 MG tablet Take at night.   No facility-administered encounter medications on file as of 06/11/2017.      Review of Systems  Constitutional: Negative for fever.  Cardiovascular: Negative for palpitations.  Skin: Negative for rash.  Neurological: Negative  for tingling.  Psychiatric/Behavioral: Negative for depression and substance abuse. The patient does not have insomnia.      Physical Exam: Constitutional:  BP 106/70 (BP Location: Left Arm, Patient Position: Sitting, Cuff Size: Normal)   Pulse 94   Ht 5\' 1"  (1.549 m)   Wt 166 lb (75.3 kg)   BMI 31.37 kg/m   General Appearance: alert, oriented, no acute distress   Psychiatric Speciality Exam: General Appearance: Casual and Well Groomed  Eye Contact::  Good  Speech:  Clear and Coherent and Normal Rate  Volume:  Normal  Mood improved  Affect recactive pleasant   Thought Process:  Coherent, Linear and Logical  Orientation:  Full (Time, Place, and Person)  Thought Content:  WDL  Suicidal Thoughts:  No  Homicidal Thoughts:  No  Memory:  Immediate;   Good Recent;   Good Remote;   Good  Judgement:  Good  Insight:  Fair  Psychomotor Activity:  Normal  Concentration:  Good  Recall:  Good  Akathisia:  No  Handed:  Right  AIMS (if indicated):     Assets:  Intimacy Resilience Social Support  Language-intact  Fund of knowledge is good     Assessment: Axis I: Bipolar II Disorder- depressed. GAD. Relationship concerns. Panic attacks    Plan:  Plan of Care:  PLAN:  1. Affirm with the patient that the medications are taken as ordered. Patient  expressed understanding of how their medications were to be used.    Laboratory: None   Psychotherapy: Therapy: brief supportive therapy provided.  Discussed psychosocial stressors in detail. More than 50% of the visit was spent on individual therapy/counseling. . Provided supportive therapy in regard to her grief and issues with her daughter  Medications:  Continue  the following psychiatric medications  Bipolar: depression; not worse. Continue lamictal. No rash Low dose wellbutrin for depression Anxiety::improved. Continue celexa  Fatigue: baseline and better. Continue wellbutrin 3 months prescriptions given. Except for xanax          Consultations: The patient was encouraged to keep all PCP and specialty clinic appointments.         Thresa RossNADEEM Antonina Deziel, M.D.  06/11/2017 8:51 AM

## 2017-07-01 ENCOUNTER — Other Ambulatory Visit (HOSPITAL_COMMUNITY): Payer: Self-pay | Admitting: Psychiatry

## 2017-08-12 ENCOUNTER — Other Ambulatory Visit (HOSPITAL_COMMUNITY): Payer: Self-pay | Admitting: Psychiatry

## 2017-09-04 ENCOUNTER — Encounter (HOSPITAL_COMMUNITY): Payer: Self-pay | Admitting: Psychiatry

## 2017-09-04 ENCOUNTER — Other Ambulatory Visit: Payer: Self-pay

## 2017-09-04 ENCOUNTER — Ambulatory Visit (INDEPENDENT_AMBULATORY_CARE_PROVIDER_SITE_OTHER): Payer: BLUE CROSS/BLUE SHIELD | Admitting: Psychiatry

## 2017-09-04 VITALS — BP 122/80 | HR 72 | Ht 61.0 in | Wt 161.0 lb

## 2017-09-04 DIAGNOSIS — Z818 Family history of other mental and behavioral disorders: Secondary | ICD-10-CM

## 2017-09-04 DIAGNOSIS — F410 Panic disorder [episodic paroxysmal anxiety] without agoraphobia: Secondary | ICD-10-CM

## 2017-09-04 DIAGNOSIS — F418 Other specified anxiety disorders: Secondary | ICD-10-CM

## 2017-09-04 DIAGNOSIS — Z638 Other specified problems related to primary support group: Secondary | ICD-10-CM

## 2017-09-04 DIAGNOSIS — F411 Generalized anxiety disorder: Secondary | ICD-10-CM | POA: Diagnosis not present

## 2017-09-04 DIAGNOSIS — F3181 Bipolar II disorder: Secondary | ICD-10-CM | POA: Diagnosis not present

## 2017-09-04 MED ORDER — LAMOTRIGINE 150 MG PO TABS: ORAL_TABLET | ORAL | 0 refills | Status: DC

## 2017-09-04 MED ORDER — BUPROPION HCL ER (SR) 100 MG PO TB12: 100.0000 mg | ORAL_TABLET | Freq: Every day | ORAL | 0 refills | Status: DC

## 2017-09-04 MED ORDER — CITALOPRAM HYDROBROMIDE 40 MG PO TABS: ORAL_TABLET | ORAL | 0 refills | Status: DC

## 2017-09-04 NOTE — Progress Notes (Signed)
Patient ID: Cynthia Barrett, female   DOB: 07/09/1965, 51 y.o.   MRN: [id number]   [facility] Follow-up Outpatient Visit  Cynthia Barrett [birth year] [id number] 52 y.o.  09/04/2017 8:50 AM  Chief Complaint:  HPI Comments: Cynthia Barrett is  a 52 y/o female with a past psychiatric history significant for Bipolar disorder. The patient returns for  medication management.   Doing fair. Less anxious. Job fair Stress related to daughter having baby and social services were involved  Duration of depression more then 10 years Timing when daughter stresses her out Depression handling it Sleep, fair Stress level is low now   No psychosis   Past Medical Family, Social History:  Past Medical History:  Diagnosis Date  . Anxiety   . Depression    Family History  Problem Relation Age of Onset  . Diabetes Mother   . Anxiety disorder Mother   . Heart defect Mother   . Diabetes Father   . Schizophrenia Paternal Aunt   . Anxiety disorder Maternal Grandmother    Social History   Socioeconomic History  . Marital status: Married    Spouse name: Not on file  . Number of children: 4  . Years of education: Not on file  . Highest education level: Not on file  Occupational History  . Not on file  Social Needs  . Financial resource strain: Not on file  . Food insecurity:    Worry: Not on file    Inability: Not on file  . Transportation needs:    Medical: Not on file    Non-medical: Not on file  Tobacco Use  . Smoking status: Never Smoker  . Smokeless tobacco: Never Used  Substance and Sexual Activity  . Alcohol use: No  . Drug use: No  . Sexual activity: Yes    Partners: Male  Lifestyle  . Physical activity:    Days per week: Not on file    Minutes per session: Not on file  . Stress: Not on file  Relationships  . Social connections:    Talks on phone: Not on file    Gets together: Not on file    Attends religious service: Not on file    Active member of club or  organization: Not on file    Attends meetings of clubs or organizations: Not on file    Relationship status: Not on file  Other Topics Concern  . Not on file  Social History Narrative   No regular exercise.     Outpatient Encounter Medications as of 09/04/2017  Medication Sig  . buPROPion (WELLBUTRIN SR) 100 MG 12 hr tablet Take 1 tablet (100 mg total) by mouth daily. Refill when due  . citalopram (CELEXA) 40 MG tablet TAKE 1 TABLET (40 MG TOTAL) BY MOUTH DAILY. TAKE 1 TABLET DAILY  . lamoTRIgine (LAMICTAL) 150 MG tablet TAKE 1 TABLET (150 MG TOTAL) BY MOUTH 2 (TWO) TIMES DAILY.  . [DISCONTINUED] ALPRAZolam (XANAX) 0.5 MG tablet Take 1 tablet (0.5 mg total) by mouth 2 (two) times daily as needed for anxiety.  . [DISCONTINUED] buPROPion (WELLBUTRIN SR) 100 MG 12 hr tablet Take 1 tablet (100 mg total) by mouth daily. Refill when due  . [DISCONTINUED] citalopram (CELEXA) 40 MG tablet TAKE 1 TABLET (40 MG TOTAL) BY MOUTH DAILY. TAKE 1 TABLET DAILY  . [DISCONTINUED] lamoTRIgine (LAMICTAL) 150 MG tablet TAKE 1 TABLET (150 MG TOTAL) BY MOUTH 2 (TWO) TIMES DAILY.  . norgestrel-ethinyl estradiol (CRYSELLE-28) 0.3-30 MG-MCG  tablet Take 1 tablet by mouth daily. (Patient not taking: Reported on 03/21/2017)  . PROVENTIL HFA 108 (90 BASE) MCG/ACT inhaler Inhale 2 puffs into the lungs every 4 (four) hours as needed for wheezing or shortness of breath. (Patient not taking: Reported on 06/11/2017)  . [DISCONTINUED] propranolol (INDERAL) 10 MG tablet TAKE 1 TAB BY MOUTH DAILY  . [DISCONTINUED] QUEtiapine (SEROQUEL) 100 MG tablet Take at night.   No facility-administered encounter medications on file as of 09/04/2017.      Review of Systems  Constitutional: Negative for fever.  Cardiovascular: Negative for chest pain.  Skin: Negative for rash.  Neurological: Negative for tingling.  Psychiatric/Behavioral: Negative for depression and substance abuse. The patient does not have insomnia.      Physical  Exam: Constitutional:  BP 122/80 (BP Location: Left Arm, Patient Position: Sitting, Cuff Size: Normal)   Pulse 72   Ht  (1.549 m)   Wt 161 lb (73 kg)   BMI 30.42 kg/m   General Appearance: alert, oriented, no acute distress   Psychiatric Speciality Exam: General Appearance: Casual and Well Groomed  Eye Contact::  Good  Speech:  Clear and Coherent and Normal Rate  Volume:  Normal  Mood fair  Affect recactive pleasant  Thought Process:  Coherent, Linear and Logical  Orientation:  Full (Time, Place, and Person)  Thought Content:  WDL  Suicidal Thoughts:  No  Homicidal Thoughts:  No  Memory:  Immediate;   Good Recent;   Good Remote;   Good  Judgement:  Good  Insight:  Fair  Psychomotor Activity:  Normal  Concentration:  Good  Recall:  Good  Akathisia:  No  Handed:  Right  AIMS (if indicated):     Assets:  Intimacy Resilience Social Support  Language-intact  Fund of knowledge is good     Assessment: Axis I: Bipolar II Disorder- depressed. GAD. Relationship concerns. Panic attacks    Plan:  Plan of Care:  PLAN:  1. Affirm with the patient that the medications are taken as ordered. Patient  expressed understanding of how their medications were to be used.    Laboratory: None   Psychotherapy: Therapy: brief supportive therapy provided.  Discussed psychosocial stressors in detail. More than 50% of the visit was spent on individual therapy/counseling. . Provided supportive therapy in regard to her grief and issues with her daughter  Medications:  Continue  the following psychiatric medications  Bipolar: depression; not worse. Continue lamictal. Low dose wellbutrin for depression Anxiety::fluctuates. Continue celexa  Fatigue: baseline and better. Continue wellbutrin 3 months prescriptions given. Except for xanax Not taking xanax as stress is better          Consultations: The patient was encouraged to keep all PCP and specialty clinic appointments.          Thresa Ross, M.D.  09/04/2017 8:50 AM

## 2017-09-10 ENCOUNTER — Ambulatory Visit (INDEPENDENT_AMBULATORY_CARE_PROVIDER_SITE_OTHER): Payer: 59 | Admitting: Licensed Clinical Social Worker

## 2017-09-10 DIAGNOSIS — F411 Generalized anxiety disorder: Secondary | ICD-10-CM | POA: Diagnosis not present

## 2017-09-10 DIAGNOSIS — F3181 Bipolar II disorder: Secondary | ICD-10-CM | POA: Diagnosis not present

## 2017-09-10 DIAGNOSIS — F4322 Adjustment disorder with anxiety: Secondary | ICD-10-CM | POA: Diagnosis not present

## 2017-09-11 NOTE — Progress Notes (Signed)
Comprehensive Clinical Assessment (CCA) Note  09/11/2017 Cynthia Barrett [id number]  Visit Diagnosis:      ICD-10-CM   1. Adjustment disorder with anxious mood F43.22   2. Bipolar II disorder (HCC) F31.81   3. GAD (generalized anxiety disorder) F41.1       CCA Part One  Part One has been completed on paper by the patient.  (See scanned document in Chart Review)  CCA Part Two A  Intake/Chief Complaint:  CCA Intake With Chief Complaint CCA Part Two Date: 09/10/17 CCA Part Two Time: 0859 Chief Complaint/Presenting Problem: Referred for therapy by our psychiatrist, Dr Gilmore Laroche.  He has been treating this patient since Sep 13, 2013.  She is seeking additional help at this time because of the stress of dealing with her younger daughter, [place] who has a history of drug abuse.  She says "I've tried to help my daughter for so many years and she has just worn me down." Patients Currently Reported Symptoms/Problems: "I'm never really relaxed."  Deals with one crisis after another.  Acknowledges "I'm trying to save her.  I'm afraid of losing her.  I already lost one child I can't lose another."  (Her son died in 2009-09-13 at the age of 14 from head trauma)    Reports having difficulties with her short term memory.   Individual's Strengths: Oldest daughter and sister are good sources of support.  Has a friend named Clydie Braun.  Husband is supportive.   Individual's Preferences: I want to know I'm making the right decisions.  I want to know if I'm being taken advantage of by my daughter.   Type of Services Patient Feels Are Needed: Therapy and continue medication management Initial Clinical Notes/Concerns: Diagnosed Bipolar II in her early 30s.  Used to have trouble socializing/talking with others.  Didn't have friends.  Stayed home.  Suicidal around 2013-hospitalized at [facility]  Now feels as though she is stablized on her current meds.    Mental Health Symptoms Depression:  Depression: Tearfulness, Fatigue,  Difficulty Concentrating  Mania:     Anxiety:   Anxiety: Tension, Worrying, Fatigue, Difficulty concentrating  Psychosis:  Psychosis: N/A  Trauma:  Trauma: N/A  Obsessions:  Obsessions: N/A  Compulsions:  Compulsions: N/A  Inattention:  Inattention: Forgetful  Hyperactivity/Impulsivity:  Hyperactivity/Impulsivity: N/A  Oppositional/Defiant Behaviors:  Oppositional/Defiant Behaviors: N/A  Borderline Personality:  Emotional Irregularity: N/A  Other Mood/Personality Symptoms:      Mental Status Exam Appearance and self-care  Stature:  Stature: Average  Weight:  Weight: Overweight  Clothing:  Clothing: Casual  Grooming:  Grooming: Normal  Cosmetic use:  Cosmetic Use: Age appropriate  Posture/gait:  Posture/Gait: Normal  Motor activity:  Motor Activity: Not Remarkable  Sensorium  Attention:  Attention: Normal  Concentration:  Concentration: Normal  Orientation:  Orientation: X5  Recall/memory:  Recall/Memory: Defective in short-term  Affect and Mood  Affect:  Affect: Appropriate  Mood:  Mood: Anxious  Relating  Eye contact:  Eye Contact: Normal  Facial expression:  Facial Expression: Responsive  Attitude toward examiner:  Attitude Toward Examiner: Cooperative  Thought and Language  Speech flow: Speech Flow: Normal  Thought content:     Preoccupation:     Hallucinations:     Organization:     Company secretary of Knowledge:  Fund of Knowledge: Average  Intelligence:  Intelligence: Average  Abstraction:     Judgement:  Judgement: Fair  Dance movement psychotherapist:  Reality Testing: Adequate  Insight:  Insight: Poor  Decision Making:  Decision Making: Vacilates  Social Functioning  Social Maturity:  Social Maturity: Responsible  Social Judgement:  Social Judgement: Normal  Stress  Stressors:  Stressors: Family conflict  Coping Ability:  Coping Ability: Building surveyor Deficits:     Supports:      Family and Psychosocial History: Family history Marital status:  Married(Married 9 years to her first husband.) Number of Years Married: 25 What types of issues is patient dealing with in the relationship?: Husband gets upset with her when she does things for her daughter.   Additional relationship information: She and her husband considered splitting up around 09-07-2011.  Does patient have children?: Yes How many children?: 4 How is patient's relationship with their children?: Oldest daughter, Heather-has custody of Brittany's daughters  Younger daughter, Brittany-currently living with patient, gave birth to a baby girl March 12th, patient is afraid of CPS saying the baby will need to go into foster care since [place] can't care for her adequately      Patient also has a 1 year old son       Older son died In 06-Sep-2009    Childhood History:     CCA Part Two B  Employment/Work Situation: Employment / Work Situation Employment situation: Employed Where is patient currently employed?: [employer] How long has patient been employed?: Almost a year (Notes prior to this she was unemployed for 8 months.  Experienced depression at that time) Patient's job has been impacted by current illness: No  Education:    Religion:    Leisure/Recreation:    Exercise/Diet: Exercise/Diet Do You Have Any Trouble Sleeping?: No  CCA Part Two C  Alcohol/Drug Use:                        CCA Part Three  ASAM's:  Six Dimensions of Multidimensional Assessment  Dimension 1:  Acute Intoxication and/or Withdrawal Potential:     Dimension 2:  Biomedical Conditions and Complications:     Dimension 3:  Emotional, Behavioral, or Cognitive Conditions and Complications:     Dimension 4:  Readiness to Change:     Dimension 5:  Relapse, Continued use, or Continued Problem Potential:     Dimension 6:  Recovery/Living Environment:      Substance use Disorder (SUD)    Social Function:  Social Functioning Social Maturity: Responsible Social Judgement:  Normal  Stress:  Stress Stressors: Family conflict Coping Ability: Overwhelmed Patient Takes Medications The Way The Doctor Instructed?: Yes  Risk Assessment- Self-Harm Potential: Risk Assessment For Self-Harm Potential Thoughts of Self-Harm: No current thoughts  Risk Assessment -Dangerous to Others Potential: Risk Assessment For Dangerous to Others Potential Method: No Plan  DSM5 Diagnoses: Patient Active Problem List   Diagnosis Date Noted  . Eye pain 11/22/2014  . 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 MENORRHAGIA 03/28/2009  . ANXIETY DISORDER, GENERALIZED 03/25/2008  . INTRINSIC ASTHMA, UNSPECIFIED 03/25/2008     Recommendations for Services/Supports/Treatments: Recommendations for Services/Supports/Treatments Recommendations For Services/Supports/Treatments: Other (Comment), Individual Therapy, Medication Management  Individual therapy is recommended for patient to learn effective coping skills for dealing with her current life stressors, particularly her relationship with her younger daughter.  This therapist plans to refer her to a therapist with more experience counseling family members of drug abusers.  Will call patient when a potential therapist is found.   Provided patient with information about Nar-Anon and encouraged her to connect with others who can relate to the struggles of loving  someone with a history of drug abuse.   Continue medication management with Dr. Anner Crete, Shawn Route

## 2017-09-13 ENCOUNTER — Telehealth (HOSPITAL_COMMUNITY): Payer: Self-pay | Admitting: Licensed Clinical Social Worker

## 2017-09-13 NOTE — Telephone Encounter (Signed)
Therapist left a voicemail requesting a return call in order to share contact information for potential referrals for therapy.

## 2017-09-18 NOTE — Telephone Encounter (Signed)
Therapist was able to reach patient and provide her with contact information for the following providers: [facility] in [place]  [phone] [facility] in [place] [phone] Neva Seat [phone]

## 2017-11-19 ENCOUNTER — Other Ambulatory Visit (HOSPITAL_COMMUNITY): Payer: Self-pay

## 2017-11-19 MED ORDER — LAMOTRIGINE 150 MG PO TABS: ORAL_TABLET | ORAL | 0 refills | Status: DC

## 2017-12-30 ENCOUNTER — Ambulatory Visit (HOSPITAL_COMMUNITY): Payer: Self-pay | Admitting: Psychiatry

## 2018-01-13 ENCOUNTER — Encounter: Payer: Self-pay | Admitting: Family Medicine

## 2018-01-13 ENCOUNTER — Ambulatory Visit (INDEPENDENT_AMBULATORY_CARE_PROVIDER_SITE_OTHER): Payer: 59 | Admitting: Family Medicine

## 2018-01-13 VITALS — BP 120/64 | HR 83 | Ht 64.0 in | Wt 159.0 lb

## 2018-01-13 DIAGNOSIS — Z23 Encounter for immunization: Secondary | ICD-10-CM

## 2018-01-13 DIAGNOSIS — Z0000 Encounter for general adult medical examination without abnormal findings: Secondary | ICD-10-CM

## 2018-01-13 DIAGNOSIS — Z1231 Encounter for screening mammogram for malignant neoplasm of breast: Secondary | ICD-10-CM

## 2018-01-13 DIAGNOSIS — Z1211 Encounter for screening for malignant neoplasm of colon: Secondary | ICD-10-CM

## 2018-01-13 NOTE — Progress Notes (Signed)
Subjective:     Cynthia Barrett is a 52 y.o. female and is here for a comprehensive physical exam. The patient reports no problems.  Social History   Socioeconomic History  . Marital status: Married    Spouse name: Not on file  . Number of children: 4  . Years of education: Not on file  . Highest education level: Not on file  Occupational History  . Not on file  Social Needs  . Financial resource strain: Not on file  . Food insecurity:    Worry: Not on file    Inability: Not on file  . Transportation needs:    Medical: Not on file    Non-medical: Not on file  Tobacco Use  . Smoking status: Never Smoker  . Smokeless tobacco: Never Used  Substance and Sexual Activity  . Alcohol use: No  . Drug use: No  . Sexual activity: Yes    Partners: Male  Lifestyle  . Physical activity:    Days per week: Not on file    Minutes per session: Not on file  . Stress: Not on file  Relationships  . Social connections:    Talks on phone: Not on file    Gets together: Not on file    Attends religious service: Not on file    Active member of club or organization: Not on file    Attends meetings of clubs or organizations: Not on file    Relationship status: Not on file  . Intimate partner violence:    Fear of current or ex partner: Not on file    Emotionally abused: Not on file    Physically abused: Not on file    Forced sexual activity: Not on file  Other Topics Concern  . Not on file  Social History Narrative   No regular exercise.    Health Maintenance  Topic Date Due  . HIV Screening  05/24/1981  . MAMMOGRAM  06/27/2015  . COLONOSCOPY  05/24/2016  . PAP SMEAR  04/10/2020  . TETANUS/TDAP  01/14/2028  . INFLUENZA VACCINE  Completed    The following portions of the patient's history were reviewed and updated as appropriate: allergies, current medications, past family history, past medical history, past social history, past surgical history and problem list.  Review of  Systems A comprehensive review of systems was negative.   Objective:    BP 120/64   Pulse 83   Ht 5\' 4"  (1.626 m)   Wt 159 lb (72.1 kg)   SpO2 98%   BMI 27.29 kg/m  General appearance: alert, cooperative and appears stated age Head: Normocephalic, without obvious abnormality, atraumatic Eyes: conj clear, EOMI, pEERLA Ears: normal TM's and external ear canals both ears Nose: Nares normal. Septum midline. Mucosa normal. No drainage or sinus tenderness. Throat: lips, mucosa, and tongue normal; teeth and gums normal Neck: no adenopathy, no carotid bruit, no JVD, supple, symmetrical, trachea midline and thyroid not enlarged, symmetric, no tenderness/mass/nodules Back: symmetric, no curvature. ROM normal. No CVA tenderness. Lungs: clear to auscultation bilaterally Breasts: normal appearance, no masses or tenderness Heart: regular rate and rhythm, S1, S2 normal, no murmur, click, rub or gallop Abdomen: soft, non-tender; bowel sounds normal; no masses,  no organomegaly Extremities: extremities normal, atraumatic, no cyanosis or edema Pulses: 2+ and symmetric Skin: Skin color, texture, turgor normal. No rashes or lesions Lymph nodes: Cervical, supraclavicular, and axillary nodes normal. Neurologic: Alert and oriented X 3, normal strength and tone. Normal symmetric reflexes. Normal  coordination and gait    Assessment:    Healthy female exam.      Plan:     See After Visit Summary for Counseling Recommendations   Keep up a regular exercise program and make sure you are eating a healthy diet Try to eat 4 servings of dairy a day, or if you are lactose intolerant take a calcium with vitamin D daily.  Your vaccines are up to date.

## 2018-01-13 NOTE — Patient Instructions (Signed)
Health Maintenance for Postmenopausal Women Menopause is a normal process in which your reproductive ability comes to an end. This process happens gradually over a span of months to years, usually between the ages of 22 and 9. Menopause is complete when you have missed 12 consecutive menstrual periods. It is important to talk with your health care provider about some of the most common conditions that affect postmenopausal women, such as heart disease, cancer, and bone loss (osteoporosis). Adopting a healthy lifestyle and getting preventive care can help to promote your health and wellness. Those actions can also lower your chances of developing some of these common conditions. What should I know about menopause? During menopause, you may experience a number of symptoms, such as:  Moderate-to-severe hot flashes.  Night sweats.  Decrease in sex drive.  Mood swings.  Headaches.  Tiredness.  Irritability.  Memory problems.  Insomnia.  Choosing to treat or not to treat menopausal changes is an individual decision that you make with your health care provider. What should I know about hormone replacement therapy and supplements? Hormone therapy products are effective for treating symptoms that are associated with menopause, such as hot flashes and night sweats. Hormone replacement carries certain risks, especially as you become older. If you are thinking about using estrogen or estrogen with progestin treatments, discuss the benefits and risks with your health care provider. What should I know about heart disease and stroke? Heart disease, heart attack, and stroke become more likely as you age. This may be due, in part, to the hormonal changes that your body experiences during menopause. These can affect how your body processes dietary fats, triglycerides, and cholesterol. Heart attack and stroke are both medical emergencies. There are many things that you can do to help prevent heart disease  and stroke:  Have your blood pressure checked at least every 1-2 years. High blood pressure causes heart disease and increases the risk of stroke.  If you are 53-22 years old, ask your health care provider if you should take aspirin to prevent a heart attack or a stroke.  Do not use any tobacco products, including cigarettes, chewing tobacco, or electronic cigarettes. If you need help quitting, ask your health care provider.  It is important to eat a healthy diet and maintain a healthy weight. ? Be sure to include plenty of vegetables, fruits, low-fat dairy products, and lean protein. ? Avoid eating foods that are high in solid fats, added sugars, or salt (sodium).  Get regular exercise. This is one of the most important things that you can do for your health. ? Try to exercise for at least 150 minutes each week. The type of exercise that you do should increase your heart rate and make you sweat. This is known as moderate-intensity exercise. ? Try to do strengthening exercises at least twice each week. Do these in addition to the moderate-intensity exercise.  Know your numbers.Ask your health care provider to check your cholesterol and your blood glucose. Continue to have your blood tested as directed by your health care provider.  What should I know about cancer screening? There are several types of cancer. Take the following steps to reduce your risk and to catch any cancer development as early as possible. Breast Cancer  Practice breast self-awareness. ? This means understanding how your breasts normally appear and feel. ? It also means doing regular breast self-exams. Let your health care provider know about any changes, no matter how small.  If you are 40  or older, have a clinician do a breast exam (clinical breast exam or CBE) every year. Depending on your age, family history, and medical history, it may be recommended that you also have a yearly breast X-ray (mammogram).  If you  have a family history of breast cancer, talk with your health care provider about genetic screening.  If you are at high risk for breast cancer, talk with your health care provider about having an MRI and a mammogram every year.  Breast cancer (BRCA) gene test is recommended for women who have family members with BRCA-related cancers. Results of the assessment will determine the need for genetic counseling and BRCA1 and for BRCA2 testing. BRCA-related cancers include these types: ? Breast. This occurs in males or females. ? Ovarian. ? Tubal. This may also be called fallopian tube cancer. ? Cancer of the abdominal or pelvic lining (peritoneal cancer). ? Prostate. ? Pancreatic.  Cervical, Uterine, and Ovarian Cancer Your health care provider may recommend that you be screened regularly for cancer of the pelvic organs. These include your ovaries, uterus, and vagina. This screening involves a pelvic exam, which includes checking for microscopic changes to the surface of your cervix (Pap test).  For women ages 21-65, health care providers may recommend a pelvic exam and a Pap test every three years. For women ages 79-65, they may recommend the Pap test and pelvic exam, combined with testing for human papilloma virus (HPV), every five years. Some types of HPV increase your risk of cervical cancer. Testing for HPV may also be done on women of any age who have unclear Pap test results.  Other health care providers may not recommend any screening for nonpregnant women who are considered low risk for pelvic cancer and have no symptoms. Ask your health care provider if a screening pelvic exam is right for you.  If you have had past treatment for cervical cancer or a condition that could lead to cancer, you need Pap tests and screening for cancer for at least 20 years after your treatment. If Pap tests have been discontinued for you, your risk factors (such as having a new sexual partner) need to be  reassessed to determine if you should start having screenings again. Some women have medical problems that increase the chance of getting cervical cancer. In these cases, your health care provider may recommend that you have screening and Pap tests more often.  If you have a family history of uterine cancer or ovarian cancer, talk with your health care provider about genetic screening.  If you have vaginal bleeding after reaching menopause, tell your health care provider.  There are currently no reliable tests available to screen for ovarian cancer.  Lung Cancer Lung cancer screening is recommended for adults 69-62 years old who are at high risk for lung cancer because of a history of smoking. A yearly low-dose CT scan of the lungs is recommended if you:  Currently smoke.  Have a history of at least 30 pack-years of smoking and you currently smoke or have quit within the past 15 years. A pack-year is smoking an average of one pack of cigarettes per day for one year.  Yearly screening should:  Continue until it has been 15 years since you quit.  Stop if you develop a health problem that would prevent you from having lung cancer treatment.  Colorectal Cancer  This type of cancer can be detected and can often be prevented.  Routine colorectal cancer screening usually begins at  age 42 and continues through age 45.  If you have risk factors for colon cancer, your health care provider may recommend that you be screened at an earlier age.  If you have a family history of colorectal cancer, talk with your health care provider about genetic screening.  Your health care provider may also recommend using home test kits to check for hidden blood in your stool.  A small camera at the end of a tube can be used to examine your colon directly (sigmoidoscopy or colonoscopy). This is done to check for the earliest forms of colorectal cancer.  Direct examination of the colon should be repeated every  5-10 years until age 71. However, if early forms of precancerous polyps or small growths are found or if you have a family history or genetic risk for colorectal cancer, you may need to be screened more often.  Skin Cancer  Check your skin from head to toe regularly.  Monitor any moles. Be sure to tell your health care provider: ? About any new moles or changes in moles, especially if there is a change in a mole's shape or color. ? If you have a mole that is larger than the size of a pencil eraser.  If any of your family members has a history of skin cancer, especially at a young age, talk with your health care provider about genetic screening.  Always use sunscreen. Apply sunscreen liberally and repeatedly throughout the day.  Whenever you are outside, protect yourself by wearing long sleeves, pants, a wide-brimmed hat, and sunglasses.  What should I know about osteoporosis? Osteoporosis is a condition in which bone destruction happens more quickly than new bone creation. After menopause, you may be at an increased risk for osteoporosis. To help prevent osteoporosis or the bone fractures that can happen because of osteoporosis, the following is recommended:  If you are 46-71 years old, get at least 1,000 mg of calcium and at least 600 mg of vitamin D per day.  If you are older than age 55 but younger than age 65, get at least 1,200 mg of calcium and at least 600 mg of vitamin D per day.  If you are older than age 54, get at least 1,200 mg of calcium and at least 800 mg of vitamin D per day.  Smoking and excessive alcohol intake increase the risk of osteoporosis. Eat foods that are rich in calcium and vitamin D, and do weight-bearing exercises several times each week as directed by your health care provider. What should I know about how menopause affects my mental health? Depression may occur at any age, but it is more common as you become older. Common symptoms of depression  include:  Low or sad mood.  Changes in sleep patterns.  Changes in appetite or eating patterns.  Feeling an overall lack of motivation or enjoyment of activities that you previously enjoyed.  Frequent crying spells.  Talk with your health care provider if you think that you are experiencing depression. What should I know about immunizations? It is important that you get and maintain your immunizations. These include:  Tetanus, diphtheria, and pertussis (Tdap) booster vaccine.  Influenza every year before the flu season begins.  Pneumonia vaccine.  Shingles vaccine.  Your health care provider may also recommend other immunizations. This information is not intended to replace advice given to you by your health care provider. Make sure you discuss any questions you have with your health care provider. Document Released: 06/01/2005  Document Revised: 10/28/2015 Document Reviewed: 01/11/2015 Elsevier Interactive Patient Education  2018 [employer]  

## 2018-01-14 ENCOUNTER — Encounter: Payer: Self-pay | Admitting: Family Medicine

## 2018-01-14 LAB — COMPLETE METABOLIC PANEL WITH GFR
AG Ratio: 1.5 (calc) (ref 1.0–2.5)
ALBUMIN MSPROF: 3.8 g/dL (ref 3.6–5.1)
ALKALINE PHOSPHATASE (APISO): 98 U/L (ref 33–130)
ALT: 13 U/L (ref 6–29)
AST: 14 U/L (ref 10–35)
BUN: 16 mg/dL (ref 7–25)
CALCIUM: 8.5 mg/dL — AB (ref 8.6–10.4)
CO2: 29 mmol/L (ref 20–32)
Chloride: 104 mmol/L (ref 98–110)
Creat: 0.85 mg/dL (ref 0.50–1.05)
GFR, EST NON AFRICAN AMERICAN: 79 mL/min/{1.73_m2} (ref >=60)
GFR, Est African American: 92 mL/min/{1.73_m2} (ref >=60)
GLOBULIN: 2.5 g/dL (ref 1.9–3.7)
Glucose, Bld: 94 mg/dL (ref 65–99)
Potassium: 4 mmol/L (ref 3.5–5.3)
SODIUM: 140 mmol/L (ref 135–146)
Total Bilirubin: 0.3 mg/dL (ref 0.2–1.2)
Total Protein: 6.3 g/dL (ref 6.1–8.1)

## 2018-01-14 LAB — LIPID PANEL
CHOL/HDL RATIO: 3.4 (calc) (ref <5.0)
Cholesterol: 204 mg/dL — ABNORMAL HIGH (ref <200)
HDL: 60 mg/dL (ref >50)
LDL Cholesterol (Calc): 125 mg/dL (calc) — ABNORMAL HIGH
NON-HDL CHOLESTEROL (CALC): 144 mg/dL — AB (ref <130)
Triglycerides: 89 mg/dL (ref <150)

## 2018-01-14 LAB — TSH: TSH: 1.67 mIU/L

## 2018-01-21 LAB — COLOGUARD: Cologuard: NEGATIVE

## 2018-01-27 ENCOUNTER — Ambulatory Visit (INDEPENDENT_AMBULATORY_CARE_PROVIDER_SITE_OTHER): Payer: BLUE CROSS/BLUE SHIELD | Admitting: Psychiatry

## 2018-01-27 ENCOUNTER — Encounter (HOSPITAL_COMMUNITY): Payer: Self-pay | Admitting: Psychiatry

## 2018-01-27 ENCOUNTER — Encounter: Payer: Self-pay | Admitting: Family Medicine

## 2018-01-27 VITALS — BP 118/74 | HR 78 | Ht 61.0 in | Wt 158.0 lb

## 2018-01-27 DIAGNOSIS — F410 Panic disorder [episodic paroxysmal anxiety] without agoraphobia: Secondary | ICD-10-CM | POA: Diagnosis not present

## 2018-01-27 DIAGNOSIS — F3181 Bipolar II disorder: Secondary | ICD-10-CM | POA: Diagnosis not present

## 2018-01-27 DIAGNOSIS — F418 Other specified anxiety disorders: Secondary | ICD-10-CM

## 2018-01-27 DIAGNOSIS — F411 Generalized anxiety disorder: Secondary | ICD-10-CM

## 2018-01-27 DIAGNOSIS — Z630 Problems in relationship with spouse or partner: Secondary | ICD-10-CM

## 2018-01-27 NOTE — Telephone Encounter (Signed)
Please call patient and let her know that her Cologuard results were negative.  So plan will be to repeat colon cancer screening in 3 years.

## 2018-01-27 NOTE — Telephone Encounter (Signed)
Pt advised of results, verbalized understanding. No further questions.  

## 2018-01-27 NOTE — Progress Notes (Signed)
Patient ID: Cynthia Barrett, female   DOB: 1965/07/06, 52 y.o.   MRN: [id number]   [facility] Follow-up Outpatient Visit  Cynthia Barrett [birth year] [id number] 52 y.o.  01/27/2018 8:45 AM  Chief Complaint:  HPI Comments: Cynthia Barrett is  a 52 y/o female with a past psychiatric history significant for Bipolar disorder. The patient returns for  medication management.   Doing fair. Likes her job. Less anxious  daughter is looking for job. Cynthia in drugs as of now   Duration of depression 10 plus years No rash Timing when daughter stresses her out Depression handling it      Past Medical Family, Social History:  Past Medical History:  Diagnosis Date  . Anxiety   . Depression    Family History  Problem Relation Age of Onset  . Diabetes Mother   . Anxiety disorder Mother   . Heart defect Mother   . Diabetes Father   . Schizophrenia Paternal Aunt   . Anxiety disorder Maternal Grandmother    Social History   Socioeconomic History  . Marital status: Married    Spouse name: Cynthia Barrett  . Number of children: 4  . Years of education: Cynthia Barrett  . Highest education level: Cynthia Barrett  Occupational History  . Cynthia Barrett  Social Needs  . Financial resource strain: Cynthia Barrett  . Food insecurity:    Worry: Cynthia Barrett    Inability: Cynthia Barrett  . Transportation needs:    Medical: Cynthia Barrett    Non-medical: Cynthia Barrett  Tobacco Use  . Smoking status: Never Smoker  . Smokeless tobacco: Never Used  Substance and Sexual Activity  . Alcohol use: No  . Drug use: No  . Sexual activity: Yes    Partners: Male  Lifestyle  . Physical activity:    Days per week: Cynthia Barrett    Minutes per session: Cynthia Barrett  . Stress: Cynthia Barrett  Relationships  . Social connections:    Talks on phone: Cynthia Barrett    Gets together: Cynthia Barrett    Attends religious service: Cynthia Barrett    Active member of club or organization: Cynthia Barrett    Attends meetings of clubs or  organizations: Cynthia Barrett    Relationship status: Cynthia Barrett  Other Topics Concern  . Cynthia Barrett  Social History Narrative   No regular exercise.     Outpatient Encounter Medications as of 01/27/2018  Medication Sig  . buPROPion (WELLBUTRIN SR) 100 MG 12 hr tablet Take 1 tablet (100 mg total) by mouth daily. Refill when due  . citalopram (CELEXA) 40 MG tablet TAKE 1 TABLET (40 MG TOTAL) BY MOUTH DAILY. TAKE 1 TABLET DAILY  . lamoTRIgine (LAMICTAL) 150 MG tablet Take 150 mg by mouth 2 (two) times daily.   No facility-administered encounter medications on Barrett as of 01/27/2018.      Review of Systems  Constitutional: Negative for fever.  Cardiovascular: Negative for palpitations.  Skin: Negative for rash.  Neurological: Negative for tingling.  Psychiatric/Behavioral: Negative for depression and substance abuse. The patient does Cynthia have insomnia.      Physical Exam: Constitutional:  BP 118/74   Pulse 78   Ht 5\' 1"  (1.549 m)   Wt 158 lb (71.7 kg)   SpO2 93%   BMI 29.85 kg/m   General Appearance: alert, oriented, no acute distress  Psychiatric Speciality Exam: General Appearance: Casual and Well Groomed  Eye Contact::  Good  Speech:  Clear and Coherent and Normal Rate  Volume:  Normal  Mood fair  Affect recactive pleasant  Thought Process:  Coherent, Linear and Logical  Orientation:  Full (Time, Place, and Person)  Thought Content:  WDL  Suicidal Thoughts:  No  Homicidal Thoughts:  No  Memory:  Immediate;   Good Recent;   Good Remote;   Good  Judgement:  Good  Insight:  Fair  Psychomotor Activity:  Normal  Concentration:  Good  Recall:  Good  Akathisia:  No  Handed:  Right  AIMS (if indicated):     Assets:  Intimacy Resilience Social Support  Language-intact  Fund of knowledge is good     Assessment: Axis I: Bipolar II Disorder- depressed. GAD. Relationship concerns. Panic attacks    Plan:  Plan of Care:  PLAN:  1. Affirm with the patient  that the medications are taken as ordered. Patient  expressed understanding of how their medications were to be used.    Laboratory: None   Psychotherapy: Therapy: brief supportive therapy provided.  Discussed psychosocial stressors in detail. More than 50% of the visit was spent on individual therapy/counseling. . Provided supportive therapy in regard to her grief and issues with her daughter  Medications:  Continue  the following psychiatric medications  Bipolar: depression; doing fair. Continue lamictal. Low dose wellbutrin for depression Anxiety::fluctuates.Cynthia worse. Continue celexa  Has got refills. Can call for more   Fatigue: baseline and better. Continue wellbutrin Fu 4-10m Cynthia taking xanax as stress is better          Consultations: The patient was encouraged to keep all PCP and specialty clinic appointments.         Thresa Ross, M.D.  01/27/2018 8:45 AM

## 2018-02-06 ENCOUNTER — Ambulatory Visit: Payer: Self-pay

## 2018-02-12 ENCOUNTER — Ambulatory Visit (INDEPENDENT_AMBULATORY_CARE_PROVIDER_SITE_OTHER): Payer: 59

## 2018-02-12 ENCOUNTER — Other Ambulatory Visit (HOSPITAL_COMMUNITY): Payer: Self-pay | Admitting: Psychiatry

## 2018-02-12 DIAGNOSIS — Z1231 Encounter for screening mammogram for malignant neoplasm of breast: Secondary | ICD-10-CM | POA: Diagnosis not present

## 2018-02-13 ENCOUNTER — Encounter: Payer: Self-pay | Admitting: Family Medicine

## 2018-02-26 ENCOUNTER — Other Ambulatory Visit (HOSPITAL_COMMUNITY): Payer: Self-pay | Admitting: Psychiatry

## 2018-03-26 ENCOUNTER — Other Ambulatory Visit (HOSPITAL_COMMUNITY): Payer: Self-pay | Admitting: Psychiatry

## 2018-05-26 ENCOUNTER — Ambulatory Visit (HOSPITAL_COMMUNITY): Payer: BLUE CROSS/BLUE SHIELD | Admitting: Psychiatry

## 2018-08-06 ENCOUNTER — Other Ambulatory Visit (HOSPITAL_COMMUNITY): Payer: Self-pay | Admitting: Psychiatry

## 2018-08-09 ENCOUNTER — Other Ambulatory Visit (HOSPITAL_COMMUNITY): Payer: Self-pay | Admitting: Psychiatry

## 2018-08-31 ENCOUNTER — Other Ambulatory Visit (HOSPITAL_COMMUNITY): Payer: Self-pay | Admitting: Psychiatry

## 2018-09-03 ENCOUNTER — Ambulatory Visit (INDEPENDENT_AMBULATORY_CARE_PROVIDER_SITE_OTHER): Payer: 59 | Admitting: Psychiatry

## 2018-09-03 ENCOUNTER — Encounter (HOSPITAL_COMMUNITY): Payer: Self-pay | Admitting: Psychiatry

## 2018-09-03 DIAGNOSIS — F3181 Bipolar II disorder: Secondary | ICD-10-CM | POA: Diagnosis not present

## 2018-09-03 DIAGNOSIS — F411 Generalized anxiety disorder: Secondary | ICD-10-CM | POA: Diagnosis not present

## 2018-09-03 DIAGNOSIS — F418 Other specified anxiety disorders: Secondary | ICD-10-CM

## 2018-09-03 MED ORDER — LAMOTRIGINE 150 MG PO TABS: 150.0000 mg | ORAL_TABLET | Freq: Every day | ORAL | 0 refills | Status: DC

## 2018-09-03 MED ORDER — BUPROPION HCL ER (SR) 100 MG PO TB12: 100.0000 mg | ORAL_TABLET | Freq: Every day | ORAL | 0 refills | Status: DC

## 2018-09-03 MED ORDER — CITALOPRAM HYDROBROMIDE 40 MG PO TABS: ORAL_TABLET | ORAL | 2 refills | Status: DC

## 2018-09-03 NOTE — Progress Notes (Signed)
Patient ID: Cynthia Barrett, female   DOB: 05/09/1965, 53 y.o.   MRN: [id number]   [facility]  Cynthia Barrett 03/17/1966 [id number] 53 y.o.  09/03/2018 8:39 AM  Chief Complaint: follow up for bipolar I connected with Cynthia Barrett on 09/03/18 at  8:30 AM EDT by a video enabled telemedicine application and verified that I am speaking with the correct person using two identifiers.   I discussed the limitations of evaluation and management by telemedicine and the availability of in person appointments. The patient expressed understanding and agreed to proceed.   HPI Comments: Cynthia Barrett is  a 53 y/o female with a past psychiatric history significant for Bipolar disorder.   Doing fair. Likes her job Daughter doing well Some cold symptoms but went away  No rash. Has cut down lamictal to 150mg  one a day instead of bid. Handling it well  Duration of depression 11 years  No rash Timing when daughter stresses her out       Past Medical Family, Social History:  Past Medical History:  Diagnosis Date  . Anxiety   . Depression    Family History  Problem Relation Age of Onset  . Diabetes Mother   . Anxiety disorder Mother   . Heart defect Mother   . Diabetes Father   . Schizophrenia Paternal Aunt   . Anxiety disorder Maternal Grandmother    Social History   Socioeconomic History  . Marital status: Married    Spouse name: Not on file  . Number of children: 4  . Years of education: Not on file  . Highest education level: Not on file  Occupational History  . Not on file  Social Needs  . Financial resource strain: Not on file  . Food insecurity:    Worry: Not on file    Inability: Not on file  . Transportation needs:    Medical: Not on file    Non-medical: Not on file  Tobacco Use  . Smoking status: Never Smoker  . Smokeless tobacco: Never Used  Substance and Sexual Activity  . Alcohol use: No  . Drug use: No  . Sexual  activity: Yes    Partners: Male  Lifestyle  . Physical activity:    Days per week: Not on file    Minutes per session: Not on file  . Stress: Not on file  Relationships  . Social connections:    Talks on phone: Not on file    Gets together: Not on file    Attends religious service: Not on file    Active member of club or organization: Not on file    Attends meetings of clubs or organizations: Not on file    Relationship status: Not on file  Other Topics Concern  . Not on file  Social History Narrative   No regular exercise.     Outpatient Encounter Medications as of 09/03/2018  Medication Sig  . buPROPion (WELLBUTRIN SR) 100 MG 12 hr tablet Take 1 tablet (100 mg total) by mouth daily. Refill when due  . buPROPion (WELLBUTRIN SR) 100 MG 12 hr tablet Take 1 tablet (100 mg total) by mouth daily. Refill when due  . citalopram (CELEXA) 40 MG tablet Take one a day  . lamoTRIgine (LAMICTAL) 150 MG tablet Take 1 tablet (150 mg total) by mouth daily.  . [DISCONTINUED] buPROPion (WELLBUTRIN SR) 100 MG 12 hr tablet TAKE 1 TABLET (100 MG TOTAL) BY MOUTH DAILY. REFILL WHEN  DUE  . [DISCONTINUED] citalopram (CELEXA) 40 MG tablet TAKE 1 TABLET (40 MG TOTAL) BY MOUTH DAILY. TAKE 1 TABLET DAILY  . [DISCONTINUED] lamoTRIgine (LAMICTAL) 150 MG tablet Take 150 mg by mouth 2 (two) times daily.  . [DISCONTINUED] lamoTRIgine (LAMICTAL) 150 MG tablet TAKE 1 TABLET BY MOUTH TWICE A DAY   No facility-administered encounter medications on file as of 09/03/2018.      Review of Systems  Cardiovascular: Negative for palpitations.  Skin: Negative for itching.  Psychiatric/Behavioral: Negative for depression and substance abuse. The patient does not have insomnia.      Physical Exam: Constitutional:  There were no vitals taken for this visit.  General Appearance: alert, oriented, no acute distress   Psychiatric Speciality Exam: General Appearance: Casual and Well Groomed  Eye Contact::  Good   Speech:  Clear and Coherent and Normal Rate  Volume:  Normal  Mood fair  Affect recactive pleasant  Thought Process:  Coherent, Linear and Logical  Orientation:  Full (Time, Place, and Person)  Thought Content:  WDL  Suicidal Thoughts:  No  Homicidal Thoughts:  No  Memory:  Immediate;   Good Recent;   Good Remote;   Good  Judgement:  Good  Insight:  Fair  Psychomotor Activity:  Normal  Concentration:  Good  Recall:  Good  Akathisia:  No  Handed:  Right  AIMS (if indicated):     Assets:  Intimacy Resilience Social Support  Language-intact  Fund of knowledge is good     Assessment: Axis I: Bipolar II Disorder- depressed. GAD. Relationship concerns. Panic attacks    Plan:  Plan of Care:  PLAN:  1. Affirm with the patient that the medications are taken as ordered. Patient  expressed understanding of how their medications were to be used.    Laboratory: None   Psychotherapy: Therapy: brief supportive therapy provided.  Discussed psychosocial stressors in detail. More than 50% of the visit was spent on individual therapy/counseling. . Provided supportive therapy in regard to her grief and issues with her daughter  Medications:  Continue  the following psychiatric medications  Bipolar: depression; doing stable. Taking lamictal 150mg  once a day, will continue Low dose wellbutrin for depression Anxiety::fluctuates.not worse. Continue celexa   Fatigue: baseline and better. Continue wellbutrin  Not taking xanax as stress is better  I discussed the assessment and treatment plan with the patient. The patient was provided an opportunity to ask questions and all were answered. The patient agreed with the plan and demonstrated an understanding of the instructions.   The patient was advised to call back or seek an in-person evaluation if the symptoms worsen or if the condition fails to improve as anticipated.  I provided 15  minutes of non-face-to-face time during this  encounter.   Fu 77m.              Thresa Ross, M.D.  09/03/2018 8:39 AM

## 2018-09-04 ENCOUNTER — Encounter: Payer: Self-pay | Admitting: Family Medicine

## 2018-09-04 ENCOUNTER — Telehealth (INDEPENDENT_AMBULATORY_CARE_PROVIDER_SITE_OTHER): Payer: 59 | Admitting: Family Medicine

## 2018-09-04 VITALS — Ht 64.0 in

## 2018-09-04 DIAGNOSIS — J4520 Mild intermittent asthma, uncomplicated: Secondary | ICD-10-CM | POA: Diagnosis not present

## 2018-09-04 DIAGNOSIS — J302 Other seasonal allergic rhinitis: Secondary | ICD-10-CM

## 2018-09-04 MED ORDER — ALBUTEROL SULFATE HFA 108 (90 BASE) MCG/ACT IN AERS: 2.0000 | INHALATION_SPRAY | Freq: Four times a day (QID) | RESPIRATORY_TRACT | 1 refills | Status: DC | PRN

## 2018-09-04 NOTE — Progress Notes (Signed)
LVM for pt informing her that I was calling to obtain her VS and go over her medications prior to her virtual visit with Dr. Linford Arnold.

## 2018-09-04 NOTE — Progress Notes (Signed)
Virtual Visit via Video Note  I connected with Cynthia Barrett on 09/04/18 at  9:30 AM EDT by a video enabled telemedicine application and verified that I am speaking with the correct person using two identifiers.   I discussed the limitations of evaluation and management by telemedicine and the availability of in person appointments. The patient expressed understanding and agreed to proceed. Pt was at home and I was in my office for the virtual visit.     Subjective:    CC: F/U Asthma -   HPI:  Asthma -patient comes today because she needs a refill on her albuterol.  She has not had problems with her asthma in quite some time in fact she still had an old albuterol inhaler left but says it was on the last few puffs.  She says starting a couple weeks ago she started developing a scratchy sore throat as well as some postnasal drip and drainage sneezing and itching.  She denies any cough fever or chills.  She wonders if it could be allergies though she is never had problems with allergies before.  Though she did get a second dog about 6 months ago and wonders if that could be contributing as well.  She says the sore throat is better but she still having a lot of itchy nose and watery nose and eyes.  More recently she had started wheezing and so started to use her old Proventil.  She also recently started some daily Allegra in the mornings but says she feels like it wears off around bedtime.   Past medical history, Surgical history, Family history not pertinant except as noted below, Social history, Allergies, and medications have been entered into the medical record, reviewed, and corrections made.   Review of Systems: No fevers, chills, night sweats, weight loss, chest pain, or shortness of breath.   Objective:    General: Speaking clearly in complete sentences without any shortness of breath.  Alert and oriented x3.  Normal judgment. No apparent acute distress.  Well-groomed.    Impression  and Recommendations:   Asthma, mild intermittent-we will send over prescription for albuterol generic inhaler.  Not sure which inhaler her plan will cover but hopefully the pharmacy can substitute based on her pharmacy preference.  Also important to help controlled allergy symptoms as below to reduce wheezing.  No other symptoms worrisome for COVID at this time but certainly be on the look out for cough shortness of breath and or fever.   Allergies, unclear if seasonal or if could be related to the new dog that she has.  Unfortunately this will just be a time will tell.  It would either get better in a few weeks or it may end up being more year-round in which case it could indicate a dog allergy.  For now recommend continuing with Allegra.  Since she does feel like it wears off at bedtime okay to add a extra tab of Benadryl at bedtime as long as it does not make her feel sedated or groggy.  Also discussed that she could use a nasal steroid spray as studies show that they are just as effective as oral antihistamines and that would be an alternative.       I discussed the assessment and treatment plan with the patient. The patient was provided an opportunity to ask questions and all were answered. The patient agreed with the plan and demonstrated an understanding of the instructions.   The patient was advised to  call back or seek an in-person evaluation if the symptoms worsen or if the condition fails to improve as anticipated.   Beatrice Lecher, MD

## 2018-09-25 ENCOUNTER — Other Ambulatory Visit (HOSPITAL_COMMUNITY): Payer: Self-pay | Admitting: Psychiatry

## 2018-11-25 ENCOUNTER — Other Ambulatory Visit (HOSPITAL_COMMUNITY): Payer: Self-pay | Admitting: Psychiatry

## 2019-01-15 ENCOUNTER — Encounter: Payer: Self-pay | Admitting: Family Medicine

## 2019-01-16 ENCOUNTER — Other Ambulatory Visit (HOSPITAL_COMMUNITY): Payer: Self-pay

## 2019-01-16 MED ORDER — CITALOPRAM HYDROBROMIDE 40 MG PO TABS: ORAL_TABLET | ORAL | 1 refills | Status: DC

## 2019-01-27 ENCOUNTER — Telehealth (HOSPITAL_COMMUNITY): Payer: Self-pay | Admitting: Psychiatry

## 2019-01-27 MED ORDER — LAMOTRIGINE 150 MG PO TABS: 150.0000 mg | ORAL_TABLET | Freq: Every day | ORAL | 0 refills | Status: DC

## 2019-01-27 NOTE — Telephone Encounter (Signed)
Pt requesting refill on Lamictal, [facility]

## 2019-01-27 NOTE — Telephone Encounter (Signed)
sent 

## 2019-02-24 ENCOUNTER — Other Ambulatory Visit (HOSPITAL_COMMUNITY): Payer: Self-pay | Admitting: Psychiatry

## 2019-02-26 ENCOUNTER — Other Ambulatory Visit (HOSPITAL_COMMUNITY): Payer: Self-pay | Admitting: Psychiatry

## 2019-03-03 ENCOUNTER — Encounter (HOSPITAL_COMMUNITY): Payer: Self-pay | Admitting: Psychiatry

## 2019-03-03 ENCOUNTER — Ambulatory Visit (INDEPENDENT_AMBULATORY_CARE_PROVIDER_SITE_OTHER): Payer: 59 | Admitting: Psychiatry

## 2019-03-03 ENCOUNTER — Other Ambulatory Visit: Payer: Self-pay

## 2019-03-03 DIAGNOSIS — F418 Other specified anxiety disorders: Secondary | ICD-10-CM | POA: Diagnosis not present

## 2019-03-03 DIAGNOSIS — F411 Generalized anxiety disorder: Secondary | ICD-10-CM | POA: Diagnosis not present

## 2019-03-03 DIAGNOSIS — F3181 Bipolar II disorder: Secondary | ICD-10-CM | POA: Diagnosis not present

## 2019-03-03 MED ORDER — ALPRAZOLAM 0.5 MG PO TABS: 0.5000 mg | ORAL_TABLET | Freq: Every day | ORAL | 0 refills | Status: DC | PRN

## 2019-03-03 NOTE — Progress Notes (Signed)
Patient ID: Cynthia Barrett, female   DOB: 27-Sep-1965, 53 y.o.   MRN: [id number]   [facility] Follow-up Outpatient Visit  Cynthia Barrett Dec 18, 1965 [id number] 53 y.o.  03/03/2019 8:37 AM  Chief Complaint: follow up for bipolar I connected with Jolayne Panther on 03/03/19 at  8:30 AM EST by a video enabled telemedicine application and verified that I am speaking with the correct person using two identifiers.   I discussed the limitations of evaluation and management by telemedicine and the availability of in person appointments. The patient expressed understanding and agreed to proceed.   HPI Comments: Cynthia Barrett is  a 53  y/o female with a past psychiatric history significant for Bipolar disorder.   Doing fair. Likes her job Some anxiety at times have to take prn xanax.  Otherwise daughter doing better,    Mood is fair on lamictal. No rash Duration of depression 11 years   Timing when daughter stresses her out    Past Medical Family, Social History:  Past Medical History:  Diagnosis Date  . Anxiety   . Depression    Family History  Problem Relation Age of Onset  . Diabetes Mother   . Anxiety disorder Mother   . Heart defect Mother   . Diabetes Father   . Schizophrenia Paternal Aunt   . Anxiety disorder Maternal Grandmother    Social History   Socioeconomic History  . Marital status: Married    Spouse name: Not on file  . Number of children: 4  . Years of education: Not on file  . Highest education level: Not on file  Occupational History  . Not on file  Social Needs  . Financial resource strain: Not on file  . Food insecurity    Worry: Not on file    Inability: Not on file  . Transportation needs    Medical: Not on file    Non-medical: Not on file  Tobacco Use  . Smoking status: Never Smoker  . Smokeless tobacco: Never Used  Substance and Sexual Activity  . Alcohol use: No  . Drug use: No  . Sexual activity: Yes    Partners: Male   Lifestyle  . Physical activity    Days per week: Not on file    Minutes per session: Not on file  . Stress: Not on file  Relationships  . Social Musician on phone: Not on file    Gets together: Not on file    Attends religious service: Not on file    Active member of club or organization: Not on file    Attends meetings of clubs or organizations: Not on file    Relationship status: Not on file  Other Topics Concern  . Not on file  Social History Narrative   No regular exercise.     Outpatient Encounter Medications as of 03/03/2019  Medication Sig  . albuterol (VENTOLIN HFA) 108 (90 Base) MCG/ACT inhaler Inhale 2 puffs into the lungs every 6 (six) hours as needed for wheezing or shortness of breath.  . ALPRAZolam (XANAX) 0.5 MG tablet Take 1 tablet (0.5 mg total) by mouth daily as needed for anxiety.  Cynthia Barrett buPROPion (WELLBUTRIN SR) 100 MG 12 hr tablet TAKE 1 TABLET (100 MG TOTAL) BY MOUTH DAILY. REFILL WHEN DUE  . citalopram (CELEXA) 40 MG tablet TAKE 1 TABLET BY MOUTH EVERY DAY  . lamoTRIgine (LAMICTAL) 150 MG tablet Take 1 tablet (150 mg total) by mouth daily.  No facility-administered encounter medications on file as of 03/03/2019.      Review of Systems  Cardiovascular: Negative for palpitations.  Skin: Negative for itching.  Psychiatric/Behavioral: Negative for depression and substance abuse. The patient does not have insomnia.      Physical Exam: Constitutional:  There were no vitals taken for this visit.  General Appearance: alert, oriented, no acute distress   Psychiatric Speciality Exam: General Appearance: Casual and Well Groomed  Eye Contact::  Good  Speech:  Clear and Coherent and Normal Rate  Volume:  Normal  Mood fair  Affect recactive pleasant  Thought Process:  Coherent, Linear and Logical  Orientation:  Full (Time, Place, and Person)  Thought Content:  WDL  Suicidal Thoughts:  No  Homicidal Thoughts:  No  Memory:  Immediate;    Good Recent;   Good Remote;   Good  Judgement:  Good  Insight:  Fair  Psychomotor Activity:  Normal  Concentration:  Good  Recall:  Good  Akathisia:  No  Handed:  Right  AIMS (if indicated):     Assets:  [place] of knowledge is good     Assessment: Axis I: Bipolar II Disorder- depressed. GAD. Relationship concerns. Panic attacks    Plan:  Plan of Care:  PLAN:  1. Affirm with the patient that the medications are taken as ordered. Patient  expressed understanding of how their medications were to be used.    Laboratory: None   Psychotherapy: Therapy: brief supportive therapy provided.  Discussed psychosocial stressors in detail. More than 50% of the visit was spent on individual therapy/counseling. . Provided supportive therapy in regard to her grief and issues with her daughter  Medications:  Continue  the following psychiatric medications  Bipolar: depression; stable. Continue lamictal Anxiety::fluctuates, continue celexa and prn xanax   Fatigue: baseline and better. Continue wellbutrin  Not taking xanax regularly  I discussed the assessment and treatment plan with the patient. The patient was provided an opportunity to ask questions and all were answered. The patient agreed with the plan and demonstrated an understanding of the instructions.   The patient was advised to call back or seek an in-person evaluation if the symptoms worsen or if the condition fails to improve as anticipated.  I provided 15  minutes of non-face-to-face time during this encounter.   Fu 63m.              Merian Capron, M.D.  03/03/2019 8:37 AM

## 2019-05-06 ENCOUNTER — Other Ambulatory Visit (HOSPITAL_COMMUNITY): Payer: Self-pay | Admitting: Psychiatry

## 2019-05-18 ENCOUNTER — Other Ambulatory Visit (HOSPITAL_BASED_OUTPATIENT_CLINIC_OR_DEPARTMENT_OTHER): Payer: Self-pay | Admitting: Family Medicine

## 2019-05-18 ENCOUNTER — Other Ambulatory Visit (HOSPITAL_BASED_OUTPATIENT_CLINIC_OR_DEPARTMENT_OTHER): Payer: Self-pay | Admitting: Nurse Practitioner

## 2019-05-18 DIAGNOSIS — Z1231 Encounter for screening mammogram for malignant neoplasm of breast: Secondary | ICD-10-CM

## 2019-05-20 ENCOUNTER — Other Ambulatory Visit: Payer: Self-pay

## 2019-05-20 ENCOUNTER — Ambulatory Visit (INDEPENDENT_AMBULATORY_CARE_PROVIDER_SITE_OTHER): Payer: 59

## 2019-05-20 DIAGNOSIS — Z1231 Encounter for screening mammogram for malignant neoplasm of breast: Secondary | ICD-10-CM

## 2019-05-21 ENCOUNTER — Ambulatory Visit (INDEPENDENT_AMBULATORY_CARE_PROVIDER_SITE_OTHER): Payer: 59 | Admitting: Family Medicine

## 2019-05-21 ENCOUNTER — Encounter: Payer: Self-pay | Admitting: Family Medicine

## 2019-05-21 VITALS — BP 117/71 | HR 84 | Ht 64.0 in | Wt 165.0 lb

## 2019-05-21 DIAGNOSIS — Z23 Encounter for immunization: Secondary | ICD-10-CM | POA: Diagnosis not present

## 2019-05-21 DIAGNOSIS — N814 Uterovaginal prolapse, unspecified: Secondary | ICD-10-CM | POA: Diagnosis not present

## 2019-05-21 DIAGNOSIS — Z0000 Encounter for general adult medical examination without abnormal findings: Secondary | ICD-10-CM

## 2019-05-21 NOTE — Patient Instructions (Signed)
Health Maintenance, Female Adopting a healthy lifestyle and getting preventive care are important in promoting health and wellness. Ask your health care provider about:  The right schedule for you to have regular tests and exams.  Things you can do on your own to prevent diseases and keep yourself healthy. What should I know about diet, weight, and exercise? Eat a healthy diet   Eat a diet that includes plenty of vegetables, fruits, low-fat dairy products, and lean protein.  Do not eat a lot of foods that are high in solid fats, added sugars, or sodium. Maintain a healthy weight Body mass index (BMI) is used to identify weight problems. It estimates body fat based on height and weight. Your health care provider can help determine your BMI and help you achieve or maintain a healthy weight. Get regular exercise Get regular exercise. This is one of the most important things you can do for your health. Most adults should:  Exercise for at least 150 minutes each week. The exercise should increase your heart rate and make you sweat (moderate-intensity exercise).  Do strengthening exercises at least twice a week. This is in addition to the moderate-intensity exercise.  Spend less time sitting. Even light physical activity can be beneficial. Watch cholesterol and blood lipids Have your blood tested for lipids and cholesterol at 53 years of age, then have this test every 5 years. Have your cholesterol levels checked more often if:  Your lipid or cholesterol levels are high.  You are older than 53 years of age.  You are at high risk for heart disease. What should I know about cancer screening? Depending on your health history and family history, you may need to have cancer screening at various ages. This may include screening for:  Breast cancer.  Cervical cancer.  Colorectal cancer.  Skin cancer.  Lung cancer. What should I know about heart disease, diabetes, and high blood  pressure? Blood pressure and heart disease  High blood pressure causes heart disease and increases the risk of stroke. This is more likely to develop in people who have high blood pressure readings, are of African descent, or are overweight.  Have your blood pressure checked: ? Every 3-5 years if you are 18-39 years of age. ? Every year if you are 40 years old or older. Diabetes Have regular diabetes screenings. This checks your fasting blood sugar level. Have the screening done:  Once every three years after age 40 if you are at a normal weight and have a low risk for diabetes.  More often and at a younger age if you are overweight or have a high risk for diabetes. What should I know about preventing infection? Hepatitis B If you have a higher risk for hepatitis B, you should be screened for this virus. Talk with your health care provider to find out if you are at risk for hepatitis B infection. Hepatitis C Testing is recommended for:  Everyone born from 1945 through 1965.  Anyone with known risk factors for hepatitis C. Sexually transmitted infections (STIs)  Get screened for STIs, including gonorrhea and chlamydia, if: ? You are sexually active and are younger than 53 years of age. ? You are older than 53 years of age and your health care provider tells you that you are at risk for this type of infection. ? Your sexual activity has changed since you were last screened, and you are at increased risk for chlamydia or gonorrhea. Ask your health care provider if   you are at risk.  Ask your health care provider about whether you are at high risk for HIV. Your health care provider may recommend a prescription medicine to help prevent HIV infection. If you choose to take medicine to prevent HIV, you should first get tested for HIV. You should then be tested every 3 months for as long as you are taking the medicine. Pregnancy  If you are about to stop having your period (premenopausal) and  you may become pregnant, seek counseling before you get pregnant.  Take 400 to 800 micrograms (mcg) of folic acid every day if you become pregnant.  Ask for birth control (contraception) if you want to prevent pregnancy. Osteoporosis and menopause Osteoporosis is a disease in which the bones lose minerals and strength with aging. This can result in bone fractures. If you are 65 years old or older, or if you are at risk for osteoporosis and fractures, ask your health care provider if you should:  Be screened for bone loss.  Take a calcium or vitamin D supplement to lower your risk of fractures.  Be given hormone replacement therapy (HRT) to treat symptoms of menopause. Follow these instructions at home: Lifestyle  Do not use any products that contain nicotine or tobacco, such as cigarettes, e-cigarettes, and chewing tobacco. If you need help quitting, ask your health care provider.  Do not use street drugs.  Do not share needles.  Ask your health care provider for help if you need support or information about quitting drugs. Alcohol use  Do not drink alcohol if: ? Your health care provider tells you not to drink. ? You are pregnant, may be pregnant, or are planning to become pregnant.  If you drink alcohol: ? Limit how much you use to 0-1 drink a day. ? Limit intake if you are breastfeeding.  Be aware of how much alcohol is in your drink. In the [place] one drink equals one 12 oz bottle of beer (355 mL), one 5 oz glass of wine (148 mL), or one 1 oz glass of hard liquor (44 mL). General instructions  Schedule regular health, dental, and eye exams.  Stay current with your vaccines.  Tell your health care provider if: ? You often feel depressed. ? You have ever been abused or do not feel safe at home. Summary  Adopting a healthy lifestyle and getting preventive care are important in promoting health and wellness.  Follow your health care provider's instructions about healthy  diet, exercising, and getting tested or screened for diseases.  Follow your health care provider's instructions on monitoring your cholesterol and blood pressure. This information is not intended to replace advice given to you by your health care provider. Make sure you discuss any questions you have with your health care provider. Document Revised: 04/02/2018 Document Reviewed: 04/02/2018 Elsevier Patient Education  2020 Elsevier Inc.  

## 2019-05-21 NOTE — Progress Notes (Signed)
Subjective:     Cynthia Barrett is a 54 y.o. female and is here for a comprehensive physical exam. The patient reports no problems.  She is actually doing really well.  She has a new job working at [employer] center and actually really enjoys it.  She admits she has not been exercising regularly.  She just had a mammogram done yesterday.  She does need to get in with GYN for her female exam so we will help arrange that.  She also let me know that she did have her hepatitis B series a couple years ago we will try to get [place] those dates.  She is interested in the shingles vaccine.  Social History   Socioeconomic History  . Marital status: Married    Spouse name: Not on file  . Number of children: 4  . Years of education: Not on file  . Highest education level: Not on file  Occupational History  . Not on file  Tobacco Use  . Smoking status: Never Smoker  . Smokeless tobacco: Never Used  Substance and Sexual Activity  . Alcohol use: No  . Drug use: No  . Sexual activity: Yes    Partners: Male  Other Topics Concern  . Not on file  Social History Narrative   No regular exercise.    Social Determinants of Health   Financial Resource Strain:   . Difficulty of Paying Living Expenses: Not on file  Food Insecurity:   . Worried About Charity fundraiser in the Last Year: Not on file  . Ran Out of Food in the Last Year: Not on file  Transportation Needs:   . Lack of Transportation (Medical): Not on file  . Lack of Transportation (Non-Medical): Not on file  Physical Activity:   . Days of Exercise per Week: Not on file  . Minutes of Exercise per Session: Not on file  Stress:   . Feeling of Stress : Not on file  Social Connections:   . Frequency of Communication with Friends and Family: Not on file  . Frequency of Social Gatherings with Friends and Family: Not on file  . Attends Religious Services: Not on file  . Active Member of Clubs or Organizations: Not on file  . Attends Theatre manager Meetings: Not on file  . Marital Status: Not on file  Intimate Partner Violence:   . Fear of Current or Ex-Partner: Not on file  . Emotionally Abused: Not on file  . Physically Abused: Not on file  . Sexually Abused: Not on file   Health Maintenance  Topic Date Due  . HIV Screening  05/24/1981  . MAMMOGRAM  02/13/2020  . PAP SMEAR-Modifier  04/10/2020  . Fecal DNA (Cologuard)  01/21/2021  . TETANUS/TDAP  01/14/2028  . INFLUENZA VACCINE  Completed    The following portions of the patient's history were reviewed and updated as appropriate: allergies, current medications, past family history, past medical history, past social history, past surgical history and problem list.  Review of Systems A comprehensive review of systems was negative.   Objective:    BP 117/71   Pulse 84   Ht 5\' 4"  (1.626 m)   Wt 165 lb (74.8 kg)   SpO2 99%   BMI 28.32 kg/m  General appearance: alert, cooperative and appears stated age Head: Normocephalic, without obvious abnormality, atraumatic Eyes: clear, EOMI, PEERLA Ears: normal TM's and external ear canals both ears Nose: Nares normal. Septum midline. Mucosa normal.  No drainage or sinus tenderness. Throat: lips, mucosa, and tongue normal; teeth and gums normal Neck: no adenopathy, no carotid bruit, no JVD, supple, symmetrical, trachea midline and thyroid not enlarged, symmetric, no tenderness/mass/nodules Back: symmetric, no curvature. ROM normal. No CVA tenderness. Lungs: clear to auscultation bilaterally Heart: regular rate and rhythm, S1, S2 normal, no murmur, click, rub or gallop Abdomen: soft, non-tender; bowel sounds normal; no masses,  no organomegaly Extremities: extremities normal, atraumatic, no cyanosis or edema Pulses: 2+ and symmetric Skin: Skin color, texture, turgor normal. No rashes or lesions Lymph nodes: Cervical, supraclavicular, and axillary nodes normal. Neurologic: Alert and oriented X 3, normal strength and  tone. Normal symmetric reflexes. Normal coordination and gait    Assessment:    Healthy female exam.      Plan:     See After Visit Summary for Counseling Recommendations   Keep up a regular exercise program and make sure you are eating a healthy diet Try to eat 4 servings of dairy a day, or if you are lactose intolerant take a calcium with vitamin D daily.  Your vaccines are up to date.  Shingles vaccine given today. Try to get Hep B vac shot record.

## 2019-05-22 LAB — COMPLETE METABOLIC PANEL WITH GFR
AG Ratio: 1.4 (calc) (ref 1.0–2.5)
ALT: 20 U/L (ref 6–29)
AST: 19 U/L (ref 10–35)
Albumin: 4 g/dL (ref 3.6–5.1)
Alkaline phosphatase (APISO): 100 U/L (ref 37–153)
BUN: 19 mg/dL (ref 7–25)
CO2: 31 mmol/L (ref 20–32)
Calcium: 9.1 mg/dL (ref 8.6–10.4)
Chloride: 103 mmol/L (ref 98–110)
Creat: 0.96 mg/dL (ref 0.50–1.05)
GFR, Est African American: 79 mL/min/{1.73_m2} (ref >=60)
GFR, Est Non African American: 68 mL/min/{1.73_m2} (ref >=60)
Globulin: 2.8 g/dL (calc) (ref 1.9–3.7)
Glucose, Bld: 104 mg/dL — ABNORMAL HIGH (ref 65–99)
Potassium: 4.6 mmol/L (ref 3.5–5.3)
Sodium: 139 mmol/L (ref 135–146)
Total Bilirubin: 0.6 mg/dL (ref 0.2–1.2)
Total Protein: 6.8 g/dL (ref 6.1–8.1)

## 2019-05-22 LAB — LIPID PANEL W/REFLEX DIRECT LDL
Cholesterol: 233 mg/dL — ABNORMAL HIGH (ref <200)
HDL: 62 mg/dL (ref >=50)
LDL Cholesterol (Calc): 145 mg/dL (calc) — ABNORMAL HIGH
Non-HDL Cholesterol (Calc): 171 mg/dL (calc) — ABNORMAL HIGH (ref <130)
Total CHOL/HDL Ratio: 3.8 (calc) (ref <5.0)
Triglycerides: 136 mg/dL (ref <150)

## 2019-05-22 LAB — TSH: TSH: 1.99 mIU/L

## 2019-05-29 ENCOUNTER — Other Ambulatory Visit (HOSPITAL_COMMUNITY): Payer: Self-pay | Admitting: Psychiatry

## 2019-05-30 ENCOUNTER — Encounter: Payer: Self-pay | Admitting: Family Medicine

## 2019-06-18 ENCOUNTER — Encounter: Payer: Self-pay | Admitting: Obstetrics & Gynecology

## 2019-06-18 ENCOUNTER — Other Ambulatory Visit: Payer: Self-pay

## 2019-06-18 ENCOUNTER — Ambulatory Visit: Payer: 59 | Admitting: Obstetrics & Gynecology

## 2019-06-18 VITALS — BP 113/60 | HR 83 | Temp 98.3°F | Resp 16 | Ht 60.75 in | Wt 167.0 lb

## 2019-06-18 DIAGNOSIS — Z124 Encounter for screening for malignant neoplasm of cervix: Secondary | ICD-10-CM

## 2019-06-18 DIAGNOSIS — Z1151 Encounter for screening for human papillomavirus (HPV): Secondary | ICD-10-CM

## 2019-06-18 DIAGNOSIS — N393 Stress incontinence (female) (male): Secondary | ICD-10-CM

## 2019-06-18 DIAGNOSIS — N8111 Cystocele, midline: Secondary | ICD-10-CM

## 2019-06-18 DIAGNOSIS — N3941 Urge incontinence: Secondary | ICD-10-CM

## 2019-06-18 DIAGNOSIS — Z01419 Encounter for gynecological examination (general) (routine) without abnormal findings: Secondary | ICD-10-CM | POA: Diagnosis not present

## 2019-06-18 NOTE — Patient Instructions (Signed)
Pelvic Organ Prolapse Pelvic organ prolapse is the stretching, bulging, or dropping of pelvic organs into an abnormal position. It happens when the muscles and tissues that surround and support pelvic structures become weak or stretched. Pelvic organ prolapse can involve the:  Vagina (vaginal prolapse).  Uterus (uterine prolapse).  Bladder (cystocele).  Rectum (rectocele).  Intestines (enterocele). When organs other than the vagina are involved, they often bulge into the vagina or protrude from the vagina, depending on how severe the prolapse is. What are the causes? This condition may be caused by:  Pregnancy, labor, and childbirth.  Past pelvic surgery.  Decreased production of the hormone estrogen associated with menopause.  Consistently lifting more than 50 lb (23 kg).  Obesity.  Long-term inability to pass stool (chronic constipation).  A cough that lasts a long time (chronic).  Buildup of fluid in the abdomen due to certain diseases and other conditions. What are the signs or symptoms? Symptoms of this condition include:  Passing a little urine (loss of bladder control) when you cough, sneeze, strain, and exercise (stress incontinence). This may be worse immediately after childbirth. It may gradually improve over time.  Feeling pressure in your pelvis or vagina. This pressure may increase when you cough or when you are passing stool.  A bulge that protrudes from the opening of your vagina.  Difficulty passing urine or stool.  Pain in your lower back.  Pain, discomfort, or disinterest in sex.  Repeated bladder infections (urinary tract infections).  Difficulty inserting a tampon. In some people, this condition causes no symptoms. How is this diagnosed? This condition may be diagnosed based on a vaginal and rectal exam. During the exam, you may be asked to cough and strain while you are lying down, sitting, and standing up. Your health care provider will  determine if other tests are required, such as bladder function tests. How is this treated? Treatment for this condition may depend on your symptoms. Treatment may include:  Lifestyle changes, such as changes to your diet.  Emptying your bladder at scheduled times (bladder training therapy). This can help reduce or avoid urinary incontinence.  Estrogen. Estrogen may help mild prolapse by increasing the strength and tone of pelvic floor muscles.  Kegel exercises. These may help mild cases of prolapse by strengthening and tightening the muscles of the pelvic floor.  A soft, flexible device that helps support the vaginal walls and keep pelvic organs in place (pessary). This is inserted into your vagina by your health care provider.  Surgery. This is often the only form of treatment for severe prolapse. Follow these instructions at home:  Avoid drinking beverages that contain caffeine or alcohol.  Increase your intake of high-fiber foods. This can help decrease constipation and straining during bowel movements.  Lose weight if recommended by your health care provider.  Wear a sanitary pad or adult diapers if you have urinary incontinence.  Avoid heavy lifting and straining with exercise and work. Do not hold your breath when you perform mild to moderate lifting and exercise activities. Limit your activities as directed by your health care provider.  Do Kegel exercises as directed by your health care provider. To do this: ? Squeeze your pelvic floor muscles tight. You should feel a tight lift in your rectal area and a tightness in your vaginal area. Keep your stomach, buttocks, and legs relaxed. ? Hold the muscles tight for up to 10 seconds. ? Relax your muscles. ? Repeat this exercise 50 times a day,   or as many times as told by your health care provider. Continue to do this exercise for at least 4-6 weeks, or for as long as told by your health care provider.  Take over-the-counter and  prescription medicines only as told by your health care provider.  If you have a pessary, take care of it as told by your health care provider.  Keep all follow-up visits as told by your health care provider. This is important. Contact a health care provider if you:  Have symptoms that interfere with your daily activities or sex life.  Need medicine to help with the discomfort.  Notice bleeding from your vagina that is not related to your period.  Have a fever.  Have pain or bleeding when you urinate.  Have bleeding when you pass stool.  Pass urine when you have sex.  Have chronic constipation.  Have a pessary that falls out.  Have bad smelling vaginal discharge.  Have an unusual, low pain in your abdomen. Summary  Pelvic organ prolapse is the stretching, bulging, or dropping of pelvic organs into an abnormal position. It happens when the muscles and tissues that surround and support pelvic structures become weak or stretched.  When organs other than the vagina are involved, they often bulge into the vagina or protrude from the vagina, depending on how severe the prolapse is.  In most cases, this condition needs to be treated only if it produces symptoms. Treatment may include lifestyle changes, estrogen, Kegel exercises, pessary insertion, or surgery.  Avoid heavy lifting and straining with exercise and work. Do not hold your breath when you perform mild to moderate lifting and exercise activities. Limit your activities as directed by your health care provider. This information is not intended to replace advice given to you by your health care provider. Make sure you discuss any questions you have with your health care provider. Document Revised: 05/01/2017 Document Reviewed: 05/01/2017 Elsevier Patient Education  2020 [employer]  

## 2019-06-18 NOTE — Progress Notes (Signed)
Subjective:     Cynthia Barrett is a 54 y.o. female here for a routine exam. G4P4 LMP 8 month prev. Had 1 period in 18 months.  Current complaints: protrusion from vagina and leakage of urine.  The 'bluge' was noted initially at her visit with her primary care provider on her last visit but, is now coming through the introitus. Pt reports that her mother and sister had prolapse as well that was managed surgically. Pt is married and sexually active. She reports leakage of urine that is becoming worse over time.  Gynecologic History Patient's last menstrual period was 11/15/2014. Contraception: post menopausal status Last Pap: 3 years prev. Results were: normal Last mammogram: 05/20/2019. Results were: normal  Obstetric History OB History  Gravida Para Term Preterm AB Living  4 4 4         SAB TAB Ectopic Multiple Live Births          4    # Outcome Date GA Lbr Len/2nd Weight Sex Delivery Anes PTL Lv  4 Term           3 Term           2 Term           1 Term            The following portions of the patient's history were reviewed and updated as appropriate: allergies, current medications, past family history, past medical history, past social history, past surgical history and problem list.  Review of Systems Pertinent items are noted in HPI.    Objective:  BP 113/60   Pulse 83   Temp 98.3 F (36.8 C)   Resp 16   Ht 5' 0.75" (1.543 m)   Wt 167 lb (75.8 kg)   LMP 11/15/2014   BMI 31.81 kg/m   General Appearance:    Alert, cooperative, no distress, appears stated age  Head:    Normocephalic, without obvious abnormality, atraumatic  Eyes:    conjunctiva/corneas clear, EOM's intact, both eyes  Ears:    Normal external ear canals, both ears  Nose:   Nares normal, septum midline, mucosa normal, no drainage    or sinus tenderness  Throat:   Lips, mucosa, and tongue normal; teeth and gums normal  Neck:   Supple, symmetrical, trachea midline, no adenopathy;    thyroid:  no  enlargement/tenderness/nodules  Back:     Symmetric, no curvature, ROM normal, no CVA tenderness  Lungs:     respirations unlabored  Chest Wall:    No tenderness or deformity   Heart:    Regular rate and rhythm  Breast Exam:    No tenderness, masses, or nipple abnormality  Abdomen:     Soft, non-tender, bowel sounds active all four quadrants,    no masses, no organomegaly  Genitalia:    Normal female without lesion, discharge or tenderness. The uterus is small with some descensus. There is redundant vaginal tissue.    Grade III cystocele. q-tip angle >90degrees; leakage of urine noted with valsalva.   Extremities:   Extremities normal, atraumatic, no cyanosis or edema  Pulses:   2+ and symmetric all extremities  Skin:   Skin color, texture, turgor normal, no rashes or lesions    Assessment:    Healthy female exam.   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    Plan:  F/u PAP with [facility] referral to Dr. [place] Pink for  F/u  post preocedure to review issue of decresased libido  Tanmay Halteman L. Harraway-Smith, M.D., [facility]

## 2019-06-19 LAB — CYTOLOGY - PAP
Comment: NEGATIVE
Diagnosis: NEGATIVE
High risk HPV: NEGATIVE

## 2019-06-24 ENCOUNTER — Telehealth: Payer: Self-pay | Admitting: *Deleted

## 2019-06-24 NOTE — Telephone Encounter (Signed)
Left patient a message with all referral information, told to call our office if she has any questions.

## 2019-07-20 ENCOUNTER — Ambulatory Visit: Payer: 59

## 2019-07-20 NOTE — Progress Notes (Deleted)
No showed for nurse visit.

## 2019-08-03 ENCOUNTER — Ambulatory Visit (HOSPITAL_COMMUNITY): Payer: 59 | Admitting: Psychiatry

## 2019-08-20 ENCOUNTER — Other Ambulatory Visit (HOSPITAL_COMMUNITY): Payer: Self-pay | Admitting: Psychiatry

## 2019-11-17 ENCOUNTER — Other Ambulatory Visit (HOSPITAL_COMMUNITY): Payer: Self-pay | Admitting: Psychiatry

## 2020-03-18 ENCOUNTER — Encounter: Payer: Self-pay | Admitting: Family Medicine

## 2020-04-23 HISTORY — PX: TOTAL LAPAROSCOPIC HYSTERECTOMY WITH SALPINGECTOMY: SHX6742

## 2020-09-01 ENCOUNTER — Encounter: Payer: Self-pay | Admitting: Family Medicine

## 2020-09-01 NOTE — Telephone Encounter (Signed)
Being that she has bipolar disorder I would highly recommend that she not just come off her medications without consulting with her psychiatrist.  Unfortunately, this is a lifelong condition and will need to have treatment.  If she is not happy with her previous psychiatrist we can certainly make a new referral for her if she would like.

## 2020-09-07 ENCOUNTER — Encounter: Payer: 59 | Admitting: Family Medicine

## 2020-09-26 ENCOUNTER — Encounter: Payer: Self-pay | Admitting: Family Medicine

## 2020-09-26 ENCOUNTER — Ambulatory Visit (INDEPENDENT_AMBULATORY_CARE_PROVIDER_SITE_OTHER): Payer: 59 | Admitting: Family Medicine

## 2020-09-26 ENCOUNTER — Other Ambulatory Visit: Payer: Self-pay

## 2020-09-26 ENCOUNTER — Telehealth: Payer: Self-pay | Admitting: Family Medicine

## 2020-09-26 VITALS — BP 114/54 | HR 72 | Ht 61.0 in | Wt 168.0 lb

## 2020-09-26 DIAGNOSIS — Z0000 Encounter for general adult medical examination without abnormal findings: Secondary | ICD-10-CM

## 2020-09-26 DIAGNOSIS — Z23 Encounter for immunization: Secondary | ICD-10-CM

## 2020-09-26 NOTE — Addendum Note (Signed)
Addended by: Deno Etienne on: 09/26/2020 09:09 AM   Modules accepted: Orders

## 2020-09-26 NOTE — Telephone Encounter (Signed)
Will discuss at her OV today

## 2020-09-26 NOTE — Telephone Encounter (Signed)
Please call pt and remind her she is dur for her 2nd shingles vaccine.

## 2020-09-26 NOTE — Patient Instructions (Signed)
Preventive Care 84-54 Years Old, Female Preventive care refers to lifestyle choices and visits with your health care provider that can promote health and wellness. This includes:  A yearly physical exam. This is also called an annual wellness visit.  Regular dental and eye exams.  Immunizations.  Screening for certain conditions.  Healthy lifestyle choices, such as: ? Eating a healthy diet. ? Getting regular exercise. ? Not using drugs or products that contain nicotine and tobacco. ? Limiting alcohol use. What can I expect for my preventive care visit? Physical exam Your health care provider will check your:  Height and weight. These may be used to calculate your BMI (body mass index). BMI is a measurement that tells if you are at a healthy weight.  Heart rate and blood pressure.  Body temperature.  Skin for abnormal spots. Counseling Your health care provider may ask you questions about your:  Past medical problems.  Family's medical history.  Alcohol, tobacco, and drug use.  Emotional well-being.  Home life and relationship well-being.  Sexual activity.  Diet, exercise, and sleep habits.  Work and work Statistician.  Access to firearms.  Method of birth control.  Menstrual cycle.  Pregnancy history. What immunizations do I need? Vaccines are usually given at various ages, according to a schedule. Your health care provider will recommend vaccines for you based on your age, medical history, and lifestyle or other factors, such as travel or where you work.   What tests do I need? Blood tests  Lipid and cholesterol levels. These may be checked every 5 years, or more often if you are over 3 years old.  Hepatitis C test.  Hepatitis B test. Screening  Lung cancer screening. You may have this screening every year starting at age 73 if you have a 30-pack-year history of smoking and currently smoke or have quit within the past 15 years.  Colorectal cancer  screening. ? All adults should have this screening starting at age 52 and continuing until age 17. ? Your health care provider may recommend screening at age 49 if you are at increased risk. ? You will have tests every 1-10 years, depending on your results and the type of screening test.  Diabetes screening. ? This is done by checking your blood sugar (glucose) after you have not eaten for a while (fasting). ? You may have this done every 1-3 years.  Mammogram. ? This may be done every 1-2 years. ? Talk with your health care provider about when you should start having regular mammograms. This may depend on whether you have a family history of breast cancer.  BRCA-related cancer screening. This may be done if you have a family history of breast, ovarian, tubal, or peritoneal cancers.  Pelvic exam and Pap test. ? This may be done every 3 years starting at age 10. ? Starting at age 11, this may be done every 5 years if you have a Pap test in combination with an HPV test. Other tests  STD (sexually transmitted disease) testing, if you are at risk.  Bone density scan. This is done to screen for osteoporosis. You may have this scan if you are at high risk for osteoporosis. Talk with your health care provider about your test results, treatment options, and if necessary, the need for more tests. Follow these instructions at home: Eating and drinking  Eat a diet that includes fresh fruits and vegetables, whole grains, lean protein, and low-fat dairy products.  Take vitamin and mineral supplements  as recommended by your health care provider.  Do not drink alcohol if: ? Your health care provider tells you not to drink. ? You are pregnant, may be pregnant, or are planning to become pregnant.  If you drink alcohol: ? Limit how much you have to 0-1 drink a day. ? Be aware of how much alcohol is in your drink. In the [place] one drink equals one 12 oz bottle of beer (355 mL), one 5 oz glass of  wine (148 mL), or one 1 oz glass of hard liquor (44 mL).   Lifestyle  Take daily care of your teeth and gums. Brush your teeth every morning and night with fluoride toothpaste. Floss one time each day.  Stay active. Exercise for at least 30 minutes 5 or more days each week.  Do not use any products that contain nicotine or tobacco, such as cigarettes, e-cigarettes, and chewing tobacco. If you need help quitting, ask your health care provider.  Do not use drugs.  If you are sexually active, practice safe sex. Use a condom or other form of protection to prevent STIs (sexually transmitted infections).  If you do not wish to become pregnant, use a form of birth control. If you plan to become pregnant, see your health care provider for a prepregnancy visit.  If told by your health care provider, take low-dose aspirin daily starting at age 50.  Find healthy ways to cope with stress, such as: ? Meditation, yoga, or listening to music. ? Journaling. ? Talking to a trusted person. ? Spending time with friends and family. Safety  Always wear your seat belt while driving or riding in a vehicle.  Do not drive: ? If you have been drinking alcohol. Do not ride with someone who has been drinking. ? When you are tired or distracted. ? While texting.  Wear a helmet and other protective equipment during sports activities.  If you have firearms in your house, make sure you follow all gun safety procedures. What's next?  Visit your health care provider once a year for an annual wellness visit.  Ask your health care provider how often you should have your eyes and teeth checked.  Stay up to date on all vaccines. This information is not intended to replace advice given to you by your health care provider. Make sure you discuss any questions you have with your health care provider. Document Revised: 01/12/2020 Document Reviewed: 12/19/2017 Elsevier Patient Education  2021 Elsevier Inc.  

## 2020-09-26 NOTE — Progress Notes (Signed)
Subjective:     Cynthia Barrett is a 55 y.o. female and is here for a comprehensive physical exam. The patient reports problems - right sided back pain. worse at night and in AM. better duing the day.  No injury.  occ pops and is painful.  Side sleeper.  Social History   Socioeconomic History  . Marital status: Married    Spouse name: Not on file  . Number of children: 4  . Years of education: Not on file  . Highest education level: Not on file  Occupational History  . Not on file  Tobacco Use  . Smoking status: Never Smoker  . Smokeless tobacco: Never Used  Vaping Use  . Vaping Use: Never used  Substance and Sexual Activity  . Alcohol use: No  . Drug use: No  . Sexual activity: Yes    Partners: Male    Birth control/protection: Surgical  Other Topics Concern  . Not on file  Social History Narrative   No regular exercise.    Social Determinants of Health   Financial Resource Strain: Not on file  Food Insecurity: Not on file  Transportation Needs: Not on file  Physical Activity: Not on file  Stress: Not on file  Social Connections: Not on file  Intimate Partner Violence: Not on file   Health Maintenance  Topic Date Due  . Pneumococcal Vaccine 10-4 Years old (1 of 2 - PPSV23) Never done  . HIV Screening  Never done  . Hepatitis C Screening  Never done  . Zoster Vaccines- Shingrix (2 of 2) 07/16/2019  . COVID-19 Vaccine (3 - Booster for Moderna series) 07/31/2020  . INFLUENZA VACCINE  11/21/2020  . Fecal DNA (Cologuard)  01/21/2021  . MAMMOGRAM  05/19/2021  . TETANUS/TDAP  01/14/2028  . HPV VACCINES  Aged Out    The following portions of the patient's history were reviewed and updated as appropriate: allergies, current medications, past family history, past medical history, past social history, past surgical history and problem list.  Review of Systems A comprehensive review of systems was negative.   Objective:    BP (!) 114/54   Ht 5\' 1"  (1.549 m)   Wt 168  lb (76.2 kg)   LMP 11/15/2014   BMI 31.74 kg/m  General appearance: alert, cooperative and appears stated age Head: Normocephalic, without obvious abnormality, atraumatic Eyes: conjh clear, EOMi, PEERLA Ears: normal TM's and external ear canals both ears Nose: Nares normal. Septum midline. Mucosa normal. No drainage or sinus tenderness. Throat: lips, mucosa, and tongue normal; teeth and gums normal Neck: no adenopathy, no carotid bruit, no JVD, supple, symmetrical, trachea midline and thyroid not enlarged, symmetric, no tenderness/mass/nodules Back: symmetric, no curvature. ROM normal. No CVA tenderness. Lungs: clear to auscultation bilaterally Breasts: normal appearance, no masses or tenderness Heart: regular rate and rhythm, S1, S2 normal, no murmur, click, rub or gallop Abdomen: soft, non-tender; bowel sounds normal; no masses,  no organomegaly Extremities: extremities normal, atraumatic, no cyanosis or edema Pulses: 2+ and symmetric Skin: Skin color, texture, turgor normal. No rashes or lesions Lymph nodes: Cervical, supraclavicular, and axillary nodes normal. Neurologic: Alert and oriented X 3, normal strength and tone. Normal symmetric reflexes. Normal coordination and gait    Assessment:    Healthy female exam.      Plan:     See After Visit Summary for Counseling Recommendations   Keep up a regular exercise program and make sure you are eating a healthy diet Try to  eat 4 servings of dairy a day, or if you are lactose intolerant take a calcium with vitamin D daily.  Your vaccines are up to date.  Second shingles vaccine given today.  Lumbago - work on stretches.  Handout provided.  Recommend putting a pillow between the knees and even to the upper body.  Starting to work on regular exercise again to strengthen course which can improve back pain if not proving over the next couple months I be happy to see her back and we can discuss further work-up at that time.

## 2020-09-27 LAB — COMPLETE METABOLIC PANEL WITH GFR
AG Ratio: 1.3 (calc) (ref 1.0–2.5)
ALT: 16 U/L (ref 6–29)
AST: 17 U/L (ref 10–35)
Albumin: 3.9 g/dL (ref 3.6–5.1)
Alkaline phosphatase (APISO): 91 U/L (ref 37–153)
BUN: 17 mg/dL (ref 7–25)
CO2: 27 mmol/L (ref 20–32)
Calcium: 9 mg/dL (ref 8.6–10.4)
Chloride: 105 mmol/L (ref 98–110)
Creat: 0.69 mg/dL (ref 0.50–1.05)
GFR, Est African American: 114 mL/min/{1.73_m2} (ref >=60)
GFR, Est Non African American: 99 mL/min/{1.73_m2} (ref >=60)
Globulin: 2.9 g/dL (calc) (ref 1.9–3.7)
Glucose, Bld: 90 mg/dL (ref 65–99)
Potassium: 4.7 mmol/L (ref 3.5–5.3)
Sodium: 141 mmol/L (ref 135–146)
Total Bilirubin: 0.2 mg/dL (ref 0.2–1.2)
Total Protein: 6.8 g/dL (ref 6.1–8.1)

## 2020-09-27 LAB — CBC
HCT: 43.1 % (ref 35.0–45.0)
Hemoglobin: 14 g/dL (ref 11.7–15.5)
MCH: 29.7 pg (ref 27.0–33.0)
MCHC: 32.5 g/dL (ref 32.0–36.0)
MCV: 91.5 fL (ref 80.0–100.0)
MPV: 10.7 fL (ref 7.5–12.5)
Platelets: 201 10*3/uL (ref 140–400)
RBC: 4.71 10*6/uL (ref 3.80–5.10)
RDW: 12 % (ref 11.0–15.0)
WBC: 6 10*3/uL (ref 3.8–10.8)

## 2020-09-27 LAB — HIV ANTIBODY (ROUTINE TESTING W REFLEX): HIV 1&2 Ab, 4th Generation: NONREACTIVE

## 2020-09-27 LAB — LIPID PANEL
Cholesterol: 220 mg/dL — ABNORMAL HIGH (ref <200)
HDL: 65 mg/dL (ref >=50)
LDL Cholesterol (Calc): 136 mg/dL (calc) — ABNORMAL HIGH
Non-HDL Cholesterol (Calc): 155 mg/dL (calc) — ABNORMAL HIGH (ref <130)
Total CHOL/HDL Ratio: 3.4 (calc) (ref <5.0)
Triglycerides: 93 mg/dL (ref <150)

## 2020-09-27 LAB — HEPATITIS C ANTIBODY
Hepatitis C Ab: NONREACTIVE
SIGNAL TO CUT-OFF: 0 (ref <1.00)

## 2021-02-13 ENCOUNTER — Encounter: Payer: Self-pay | Admitting: Family Medicine

## 2021-02-13 DIAGNOSIS — Z1211 Encounter for screening for malignant neoplasm of colon: Secondary | ICD-10-CM

## 2021-03-02 LAB — COLOGUARD: COLOGUARD: NEGATIVE

## 2021-03-02 NOTE — Progress Notes (Signed)
Your cologuard is normal. Repeat colon cancer screen in 3 years.

## 2021-03-27 ENCOUNTER — Encounter: Payer: Self-pay | Admitting: Family Medicine

## 2021-09-27 ENCOUNTER — Ambulatory Visit (INDEPENDENT_AMBULATORY_CARE_PROVIDER_SITE_OTHER): Payer: 59 | Admitting: Family Medicine

## 2021-09-27 ENCOUNTER — Encounter: Payer: Self-pay | Admitting: Family Medicine

## 2021-09-27 VITALS — BP 115/44 | HR 66 | Ht 61.0 in | Wt 162.0 lb

## 2021-09-27 DIAGNOSIS — Z1231 Encounter for screening mammogram for malignant neoplasm of breast: Secondary | ICD-10-CM | POA: Diagnosis not present

## 2021-09-27 DIAGNOSIS — Z0000 Encounter for general adult medical examination without abnormal findings: Secondary | ICD-10-CM

## 2021-09-27 DIAGNOSIS — M545 Low back pain, unspecified: Secondary | ICD-10-CM

## 2021-09-27 DIAGNOSIS — G8929 Other chronic pain: Secondary | ICD-10-CM | POA: Diagnosis not present

## 2021-09-27 MED ORDER — MELOXICAM 15 MG PO TABS: 15.0000 mg | ORAL_TABLET | Freq: Every day | ORAL | 3 refills | Status: DC

## 2021-09-27 NOTE — Progress Notes (Addendum)
Complete physical exam  Patient: Cynthia Barrett   DOB: 04/04/1966   56 y.o. Female  MRN: [id number]  Subjective:    Chief Complaint  Patient presents with   Annual Exam    Cynthia Barrett is a 56 y.o. female who presents today for a complete physical exam. She reports consuming a general diet. The patient does not participate in regular exercise at present. She generally feels well. She does not have additional problems to discuss today.   She recently has lost about 8 pounds.  She started doing intermittent fasting about 6 weeks ago and over the last 2 weeks that she has been eating only plant-based diet.  Her goal is to lose at least 20 pounds total.  She has been struggling with some low to mid back pain for a little right over a year.  In fact she went to see a chiropractor about 7 times.  And then they commended an x-ray.  She has not had that done.  She has taken a few of her husband's meloxicam and does find that helpful.  She says right now although her goal is to get some joint extra weight off and see if that helps her back and if it does not then she will come back in for follow-up and further evaluation for her back.  Most recent fall risk assessment:    09/27/2021    8:48 AM  Fall Risk   Falls in the past year? 0  Number falls in past yr: 0  Injury with Fall? 0  Risk for fall due to : No Fall Risks  Follow up Falls prevention discussed     Most recent depression screenings:    09/27/2021    8:48 AM 09/26/2020    9:04 AM  PHQ 2/9 Scores  PHQ - 2 Score 0 0      Past Surgical History:  Procedure Laterality Date   TOTAL LAPAROSCOPIC HYSTERECTOMY WITH SALPINGECTOMY  2022   wFU   TUBAL LIGATION  2004   tummy tuck  10/2004   Social History   Tobacco Use   Smoking status: Never   Smokeless tobacco: Never  Vaping Use   Vaping Use: Never used  Substance Use Topics   Alcohol use: No   Drug use: No   Family History  Problem Relation Age of Onset   Diabetes Mother     Anxiety disorder Mother    Heart defect Mother    Diabetes Father    Anxiety disorder Maternal Grandmother    Schizophrenia Maternal Uncle       Patient Care Team: Agapito GamesMetheney, Abdifatah Colquhoun D, MD as PCP - General   Outpatient Medications Prior to Visit  Medication Sig   albuterol (VENTOLIN HFA) 108 (90 Base) MCG/ACT inhaler Inhale 2 puffs into the lungs every 6 (six) hours as needed for wheezing or shortness of breath.   Cyanocobalamin (VITAMIN B12 PO) Take by mouth.   Multiple Vitamin (MULTIVITAMIN) tablet Take 1 tablet by mouth daily.   No facility-administered medications prior to visit.    ROS        Objective:     BP (!) 115/44   Pulse 66   Ht 5\' 1"  (1.549 m)   Wt 162 lb (73.5 kg)   LMP 11/15/2014   SpO2 99%   BMI 30.61 kg/m    Physical Exam Vitals and nursing note reviewed. Exam conducted with a chaperone present.  Constitutional:      Appearance: She is  well-developed.  HENT:     Head: Normocephalic and atraumatic.     Right Ear: External ear normal.     Left Ear: External ear normal.     Nose: Nose normal.  Eyes:     Conjunctiva/sclera: Conjunctivae normal.     Pupils: Pupils are equal, round, and reactive to light.  Neck:     Thyroid: No thyromegaly.  Cardiovascular:     Rate and Rhythm: Normal rate and regular rhythm.     Heart sounds: Normal heart sounds.  Pulmonary:     Effort: Pulmonary effort is normal.     Breath sounds: Normal breath sounds. No wheezing.  Chest:  Breasts:    Right: Normal.     Left: Normal.  Abdominal:     General: Bowel sounds are normal.     Palpations: Abdomen is soft.     Tenderness: There is no abdominal tenderness.  Musculoskeletal:     Cervical back: Neck supple.  Lymphadenopathy:     Cervical: No cervical adenopathy.     Upper Body:     Right upper body: No supraclavicular or axillary adenopathy.     Left upper body: No supraclavicular or axillary adenopathy.  Skin:    General: Skin is warm and dry.   Neurological:     General: No focal deficit present.     Mental Status: She is alert and oriented to person, place, and time.  Psychiatric:        Behavior: Behavior normal.     No results found for any visits on 09/27/21.     Assessment & Plan:    Routine Health Maintenance and Physical Exam  Immunization History  Administered Date(s) Administered   Hepatitis B 06/08/2017, 07/10/2017, 01/23/2018   Influenza Whole 02/14/2009, 12/22/2009   Influenza, Quadrivalent, Recombinant, Inj, Pf 01/22/2019   Influenza,inj,Quad PF,6+ Mos 01/13/2018   Influenza-Unspecified 02/22/2015, 01/10/2016   Moderna Sars-Covid-2 Vaccination 02/03/2020, 03/02/2020, 09/02/2020   Pneumococcal Polysaccharide-23 08/17/2013   Td 01/31/2007   Tdap 02/22/2016, 01/13/2018   Zoster Recombinat (Shingrix) 05/21/2019, 09/26/2020    Health Maintenance  Topic Date Due   COVID-19 Vaccine (4 - Booster for Moderna series) 10/28/2020   MAMMOGRAM  05/19/2021   INFLUENZA VACCINE  11/21/2021   Fecal DNA (Cologuard)  02/22/2024   TETANUS/TDAP  01/14/2028   Hepatitis C Screening  Completed   HIV Screening  Completed   Zoster Vaccines- Shingrix  Completed   Pneumococcal Vaccine 32-30 Years old  Aged Out   HPV VACCINES  Aged Out    Discussed health benefits of physical activity, and encouraged her to engage in regular exercise appropriate for her age and condition.  Problem List Items Addressed This Visit   None Visit Diagnoses     Wellness examination    -  Primary   Relevant Orders   MM 3D SCREEN BREAST BILATERAL   Lipid Panel w/reflex Direct LDL   COMPLETE METABOLIC PANEL WITH GFR   CBC   Screening mammogram for breast cancer       Relevant Orders   MM 3D SCREEN BREAST BILATERAL   Chronic midline low back pain without sciatica       Relevant Medications   meloxicam (MOBIC) 15 MG tablet       Keep up a regular exercise program and make sure you are eating a healthy diet Try to eat 4 servings of  dairy a day, or if you are lactose intolerant take a calcium with vitamin D daily.  Your  vaccines are up to date.   Return in about 1 year (around 09/28/2022) for Wellness Exam.     Nani Gasser, MD

## 2021-09-28 LAB — CBC
HCT: 42.1 % (ref 35.0–45.0)
Hemoglobin: 14 g/dL (ref 11.7–15.5)
MCH: 30.6 pg (ref 27.0–33.0)
MCHC: 33.3 g/dL (ref 32.0–36.0)
MCV: 92.1 fL (ref 80.0–100.0)
MPV: 11.2 fL (ref 7.5–12.5)
Platelets: 182 10*3/uL (ref 140–400)
RBC: 4.57 10*6/uL (ref 3.80–5.10)
RDW: 12.4 % (ref 11.0–15.0)
WBC: 5 10*3/uL (ref 3.8–10.8)

## 2021-09-28 LAB — COMPLETE METABOLIC PANEL WITH GFR
AG Ratio: 1.6 (calc) (ref 1.0–2.5)
ALT: 16 U/L (ref 6–29)
AST: 18 U/L (ref 10–35)
Albumin: 4.2 g/dL (ref 3.6–5.1)
Alkaline phosphatase (APISO): 109 U/L (ref 37–153)
BUN: 20 mg/dL (ref 7–25)
CO2: 29 mmol/L (ref 20–32)
Calcium: 9.1 mg/dL (ref 8.6–10.4)
Chloride: 104 mmol/L (ref 98–110)
Creat: 0.8 mg/dL (ref 0.50–1.03)
Globulin: 2.6 g/dL (calc) (ref 1.9–3.7)
Glucose, Bld: 88 mg/dL (ref 65–99)
Potassium: 5.1 mmol/L (ref 3.5–5.3)
Sodium: 138 mmol/L (ref 135–146)
Total Bilirubin: 0.4 mg/dL (ref 0.2–1.2)
Total Protein: 6.8 g/dL (ref 6.1–8.1)
eGFR: 87 mL/min/{1.73_m2} (ref >=60)

## 2021-09-28 LAB — LIPID PANEL W/REFLEX DIRECT LDL
Cholesterol: 175 mg/dL (ref <200)
HDL: 52 mg/dL (ref >=50)
LDL Cholesterol (Calc): 100 mg/dL (calc) — ABNORMAL HIGH
Non-HDL Cholesterol (Calc): 123 mg/dL (calc) (ref <130)
Total CHOL/HDL Ratio: 3.4 (calc) (ref <5.0)
Triglycerides: 130 mg/dL (ref <150)

## 2021-09-28 NOTE — Progress Notes (Signed)
HI Cynthia Barrett,  Cholesterol looks much better this year, great work and bringing that down.  Metabolic panel and blood count are normal.

## 2021-11-01 ENCOUNTER — Ambulatory Visit (INDEPENDENT_AMBULATORY_CARE_PROVIDER_SITE_OTHER): Payer: 59

## 2021-11-01 DIAGNOSIS — Z1231 Encounter for screening mammogram for malignant neoplasm of breast: Secondary | ICD-10-CM | POA: Diagnosis not present

## 2021-11-03 NOTE — Progress Notes (Signed)
Please call patient. Normal mammogram.  Repeat in 1 year.  

## 2021-11-05 IMAGING — MG DIGITAL SCREENING BILAT W/ TOMO W/ CAD
8 series · 8 of 24 positions shown · non-contrast
Comparison: Previous exam(s).

CLINICAL DATA: Screening.

EXAM:
DIGITAL SCREENING BILATERAL MAMMOGRAM WITH TOMO AND CAD

[L CC synth-2D]
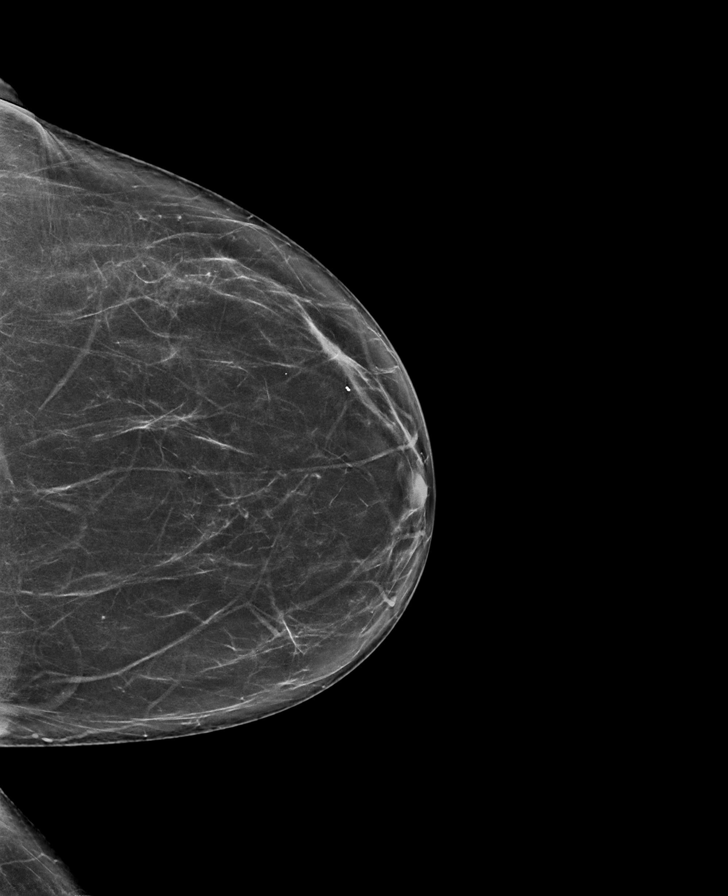

[R CC synth-2D]
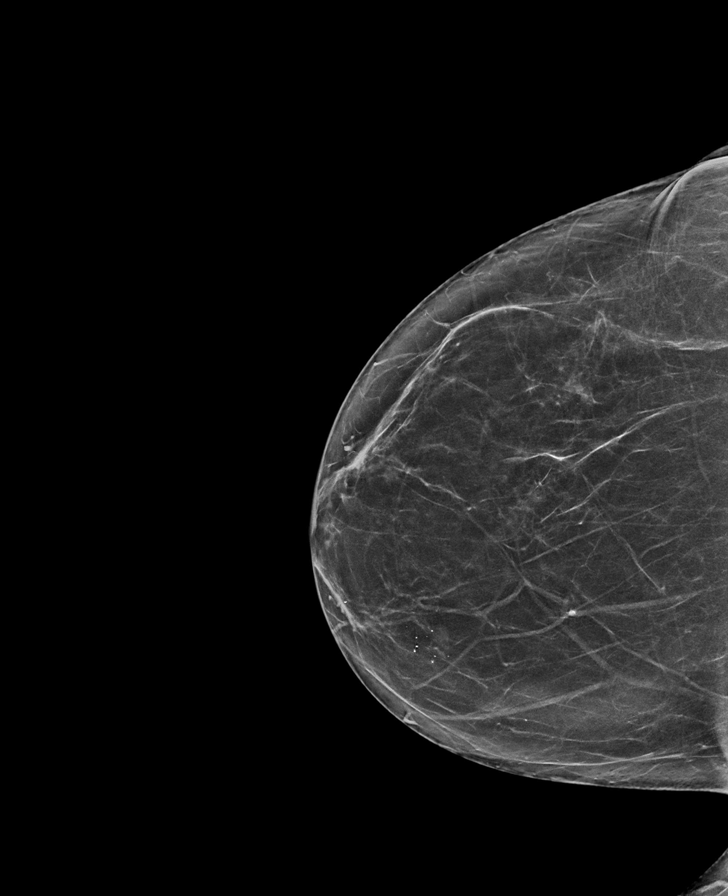

[R MLO synth-2D]
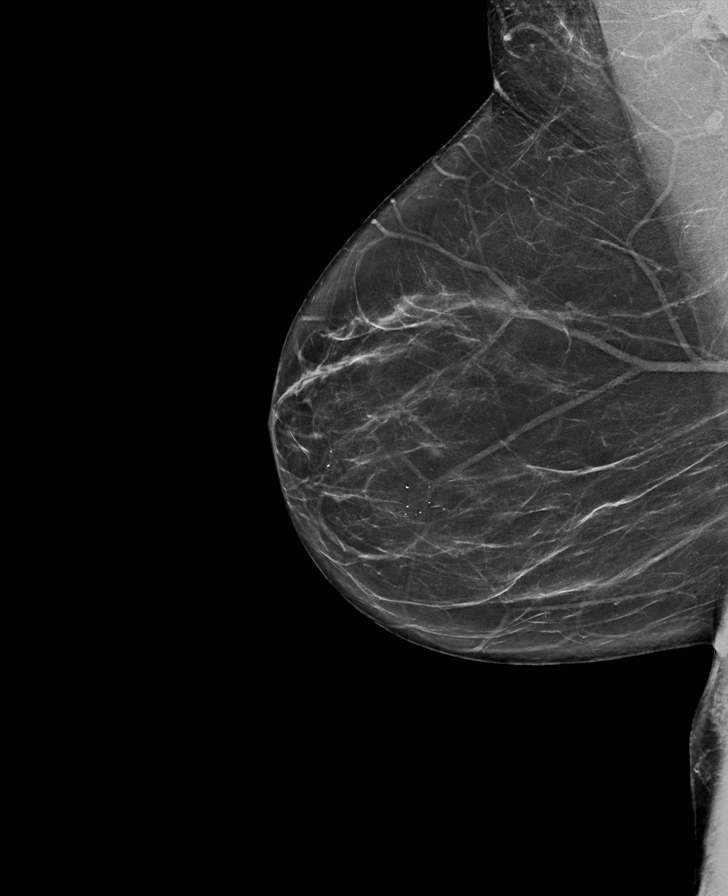

[L MLO synth-2D]
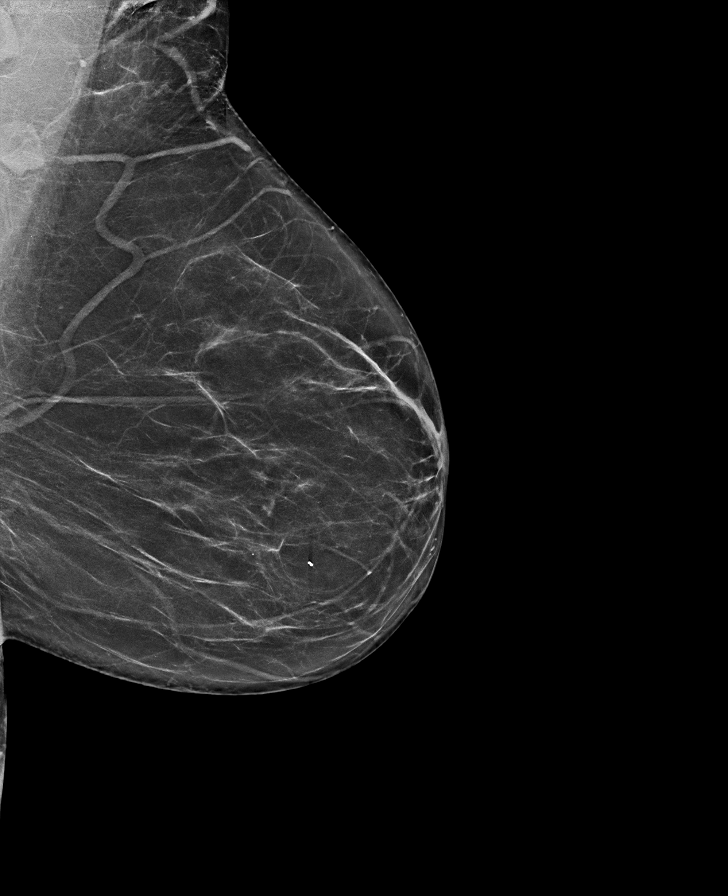

[R CC tomo · tomo slice 37/73.0]
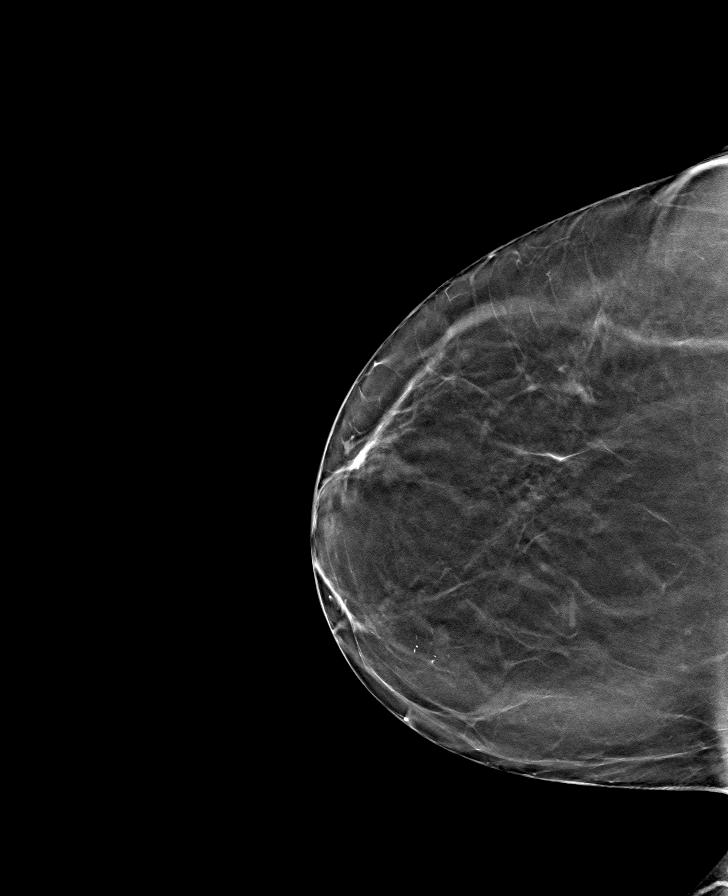

[R MLO tomo · tomo slice 37/74.0]
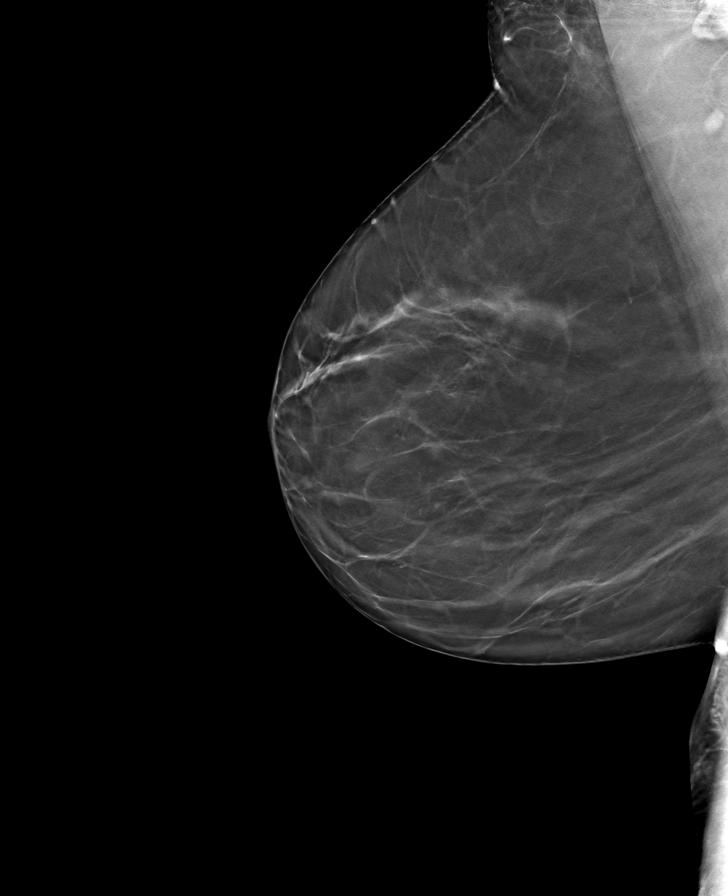

[L CC tomo · tomo slice 35/69.0]
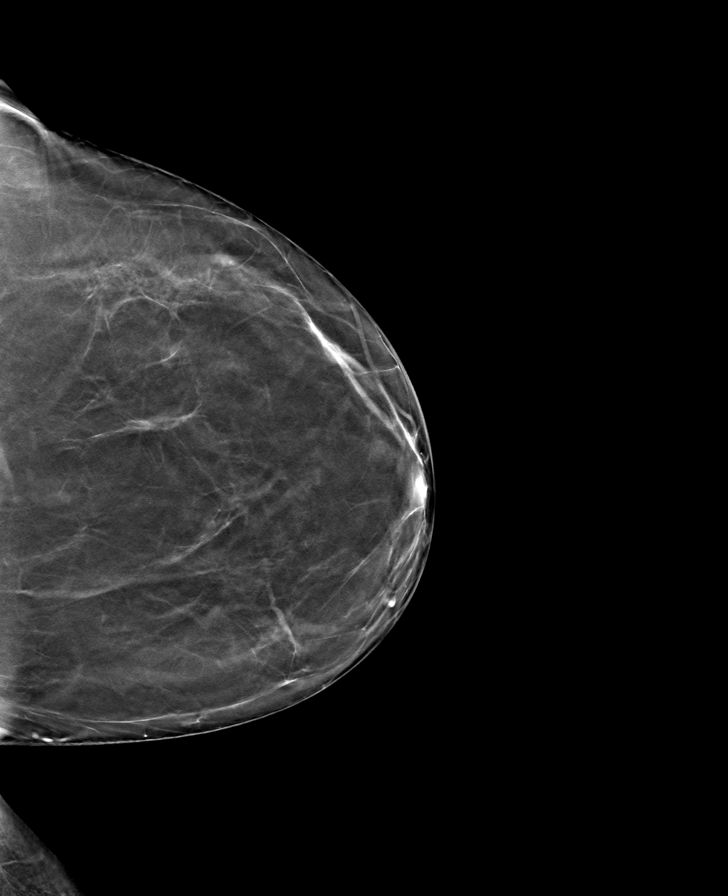

[L MLO tomo · tomo slice 37/73.0]
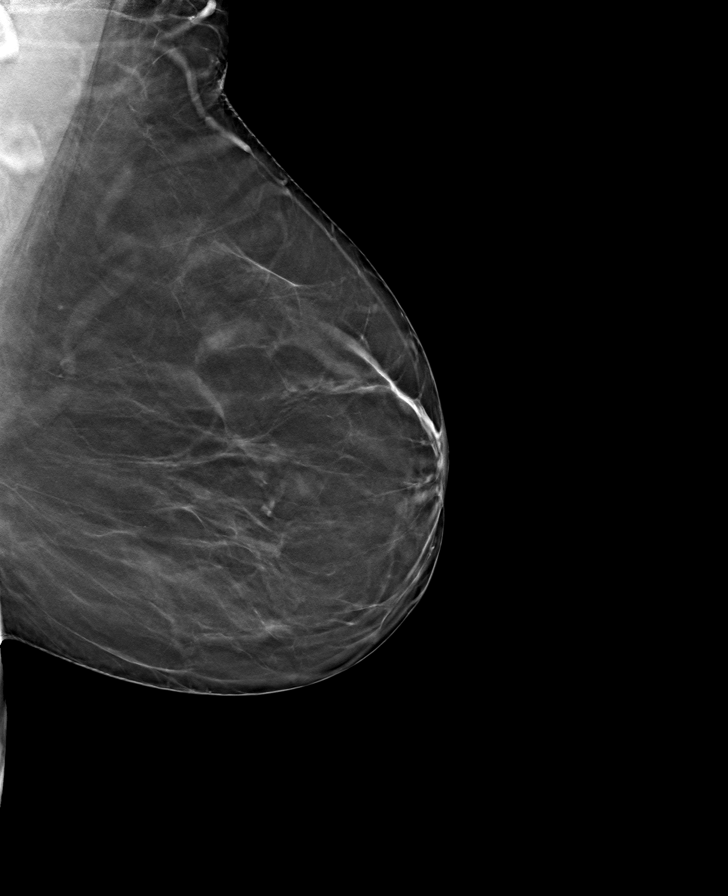

[8 of 24 positions shown; findings below may reference images not displayed]

ACR Breast Density Category b: There are scattered areas of
fibroglandular density.
FINDINGS: There are no findings suspicious for malignancy. Images were
processed with CAD.
IMPRESSION: No mammographic evidence of malignancy. A result letter of this
screening mammogram will be mailed directly to the patient.

RECOMMENDATION:
Screening mammogram in one year. (Code:[id number])

BI-RADS CATEGORY  1: Negative.

## 2022-08-27 ENCOUNTER — Ambulatory Visit: Payer: 59 | Admitting: Family Medicine

## 2022-09-14 ENCOUNTER — Ambulatory Visit (INDEPENDENT_AMBULATORY_CARE_PROVIDER_SITE_OTHER): Payer: 59

## 2022-09-14 ENCOUNTER — Encounter: Payer: Self-pay | Admitting: Family Medicine

## 2022-09-14 ENCOUNTER — Ambulatory Visit (INDEPENDENT_AMBULATORY_CARE_PROVIDER_SITE_OTHER): Payer: 59 | Admitting: Family Medicine

## 2022-09-14 VITALS — BP 112/69 | HR 86 | Ht 61.0 in | Wt 146.0 lb

## 2022-09-14 DIAGNOSIS — M545 Low back pain, unspecified: Secondary | ICD-10-CM | POA: Diagnosis not present

## 2022-09-14 DIAGNOSIS — G8929 Other chronic pain: Secondary | ICD-10-CM

## 2022-09-14 NOTE — Progress Notes (Signed)
Acute Office Visit  Subjective:     Patient ID: Cynthia Barrett, female    DOB: 02/09/1966, 57 y.o.   MRN: [id number]  Chief Complaint  Patient presents with   Back Pain    HPI Patient is in today for right  low back pain that she has had for years.  Last spinal film that I can find in the chart was from 2015.  Usually relies on meloxicam and Tylenol and it does help some.  On her plain film back in 2015 that showed moderate loss of disc height at L4-5 and mild loss of disc height at L2-3.  As well as some mild curvature to the left and some mild facet arthritis at L4-5 L5-S1.  The back pain does not keep her from doing her work or walking during the day is just more painful and bothersome when she is sitting.  She says about 2 weeks ago she was scooting off the edge of a chair and had sudden right sided back pain to the point that she could not stand up and had to get her husband to help her and that is when she decided to make the appointment.  She is vegan and has lost about 25 pounds.  She is planning on doing some more resistance training as she has lost some muscle mass.  With her current job she is not as physically active.  ROS      Objective:    BP 112/69   Pulse 86   Ht 5\' 1"  (1.549 m)   Wt 146 lb (66.2 kg)   LMP 11/15/2014   SpO2 98%   BMI 27.59 kg/m     Physical Exam Vitals reviewed.  Constitutional:      Appearance: She is well-developed.  HENT:     Head: Normocephalic and atraumatic.  Eyes:     Conjunctiva/sclera: Conjunctivae normal.  Cardiovascular:     Rate and Rhythm: Normal rate.  Pulmonary:     Effort: Pulmonary effort is normal.  Musculoskeletal:     Comments: Normal lumbar flexion and extension.  Rotation is pretty symmetric that she did have pain with the rotation.  Pain with sidebending as well.  Negative straight leg raise bilaterally.  Hip, knee strength is 5-5  Skin:    General: Skin is dry.     Coloration: Skin is not pale.  Neurological:      Mental Status: She is alert and oriented to person, place, and time.  Psychiatric:        Behavior: Behavior normal.     No results found for any visits on 09/14/22.      Assessment & Plan:   Problem List Items Addressed This Visit   None Visit Diagnoses     Chronic right-sided low back pain without sciatica    -  Primary   Relevant Orders   DG Lumbar Spine Complete      Right-sided low back pain-I was able to find the old films from 2015 showing some degenerative disks and some mild facet arthritis.  Will get up-to-date it films because she does feel like her back has become more problematic.  She wants to just take with Tylenol for pain relief for now.  Given some handout on stretches to do on her own at home in particular the cat and camel.  Also will refer for formal PT depending on the spine film results.  No orders of the defined types were placed in this encounter.  No follow-ups on file.  Nani Gasser, MD

## 2022-09-19 ENCOUNTER — Encounter: Payer: Self-pay | Admitting: Family Medicine

## 2022-09-20 ENCOUNTER — Encounter: Payer: Self-pay | Admitting: Family Medicine

## 2022-09-20 NOTE — Telephone Encounter (Signed)
I called [facility] and they will change to stat read.

## 2022-09-20 NOTE — Progress Notes (Signed)
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  They did note that it looked like it had progressed compared to your old films from 2015.  I would recommend a trial of formal PT for at least 6 weeks.  If you are not improving at that point then we can consider further workup with MRI.

## 2022-09-20 NOTE — Telephone Encounter (Signed)
Report not back. We may need to call imaging

## 2022-09-21 ENCOUNTER — Other Ambulatory Visit: Payer: Self-pay | Admitting: *Deleted

## 2022-09-21 DIAGNOSIS — G8929 Other chronic pain: Secondary | ICD-10-CM

## 2022-09-21 NOTE — Telephone Encounter (Signed)
OK to place referral

## 2022-09-21 NOTE — Addendum Note (Signed)
Addended by: Deno Etienne on: 09/21/2022 09:17 AM   Modules accepted: Orders

## 2022-09-21 NOTE — Telephone Encounter (Signed)
Ordered by Archie Patten.

## 2022-09-29 ENCOUNTER — Encounter: Payer: Self-pay | Admitting: Family Medicine

## 2022-10-01 ENCOUNTER — Encounter: Payer: 59 | Admitting: Family Medicine

## 2022-10-15 ENCOUNTER — Ambulatory Visit (INDEPENDENT_AMBULATORY_CARE_PROVIDER_SITE_OTHER): Payer: 59 | Admitting: Family Medicine

## 2022-10-15 ENCOUNTER — Encounter: Payer: Self-pay | Admitting: Family Medicine

## 2022-10-15 VITALS — BP 113/45 | HR 70 | Ht 61.0 in | Wt 145.0 lb

## 2022-10-15 DIAGNOSIS — Z0000 Encounter for general adult medical examination without abnormal findings: Secondary | ICD-10-CM | POA: Diagnosis not present

## 2022-10-15 NOTE — Progress Notes (Signed)
Complete physical exam  Patient: Cynthia Barrett   DOB: 09/13/1965   57 y.o. Female  MRN: [id number]  Subjective:    Chief Complaint  Patient presents with   Annual Exam    PHIL CORTI is a 57 y.o. female who presents today for a complete physical exam. She reports consuming a general diet.  Walks at work and doing some home stretches.   She generally feels well. She reports sleeping well. She does have additional problems to discuss today.    Most recent fall risk assessment:    10/15/2022    7:36 AM  Fall Risk   Falls in the past year? 0  Number falls in past yr: 0  Injury with Fall? 0  Risk for fall due to : No Fall Risks  Follow up Falls evaluation completed     Most recent depression screenings:    10/15/2022    7:36 AM 09/27/2021    8:48 AM  PHQ 2/9 Scores  PHQ - 2 Score 0 0        Patient Care Team: Agapito Games, MD as PCP - General   Outpatient Medications Prior to Visit  Medication Sig   Cyanocobalamin (VITAMIN B12 PO) Take by mouth.   Multiple Vitamin (MULTIVITAMIN) tablet Take 1 tablet by mouth daily.   [DISCONTINUED] albuterol (VENTOLIN HFA) 108 (90 Base) MCG/ACT inhaler Inhale 2 puffs into the lungs every 6 (six) hours as needed for wheezing or shortness of breath.   [DISCONTINUED] meloxicam (MOBIC) 15 MG tablet Take 1 tablet (15 mg total) by mouth daily.   No facility-administered medications prior to visit.    ROS        Objective:     BP (!) 113/45   Pulse 70   Ht 5\' 1"  (1.549 m)   Wt 145 lb (65.8 kg)   LMP 11/15/2014   SpO2 100%   BMI 27.40 kg/m    Physical Exam Vitals and nursing note reviewed.  Constitutional:      Appearance: Normal appearance. She is well-developed.  HENT:     Head: Normocephalic and atraumatic.     Right Ear: Tympanic membrane, ear canal and external ear normal.     Left Ear: Tympanic membrane, ear canal and external ear normal.     Nose: Nose normal.     Mouth/Throat:     Pharynx:  Oropharynx is clear.  Eyes:     Conjunctiva/sclera: Conjunctivae normal.     Pupils: Pupils are equal, round, and reactive to light.  Neck:     Thyroid: No thyromegaly.  Cardiovascular:     Rate and Rhythm: Normal rate and regular rhythm.     Heart sounds: Normal heart sounds.  Pulmonary:     Effort: Pulmonary effort is normal.     Breath sounds: Normal breath sounds. No wheezing.  Abdominal:     General: Bowel sounds are normal.     Palpations: Abdomen is soft.  Musculoskeletal:     Cervical back: Neck supple.  Lymphadenopathy:     Cervical: No cervical adenopathy.  Skin:    General: Skin is warm and dry.  Neurological:     Mental Status: She is alert and oriented to person, place, and time.  Psychiatric:        Behavior: Behavior normal.      No results found for any visits on 10/15/22.     Assessment & Plan:    Routine Health Maintenance and Physical Exam  Immunization  History  Administered Date(s) Administered   Hepatitis B 06/08/2017, 07/10/2017, 01/23/2018   Influenza Whole 02/14/2009, 12/22/2009   Influenza, Quadrivalent, Recombinant, Inj, Pf 01/22/2019   Influenza,inj,Quad PF,6+ Mos 01/13/2018   Influenza-Unspecified 02/22/2015, 01/10/2016   Moderna Sars-Covid-2 Vaccination 02/03/2020, 03/02/2020, 09/02/2020   Pneumococcal Polysaccharide-23 08/17/2013   Td 01/31/2007   Tdap 02/22/2016, 01/13/2018   Zoster Recombinat (Shingrix) 05/21/2019, 09/26/2020    Health Maintenance  Topic Date Due   COVID-19 Vaccine (4 - 2023-24 season) 10/31/2023 (Originally 12/22/2021)   INFLUENZA VACCINE  11/22/2022   MAMMOGRAM  11/02/2023   Fecal DNA (Cologuard)  02/22/2024   DTaP/Tdap/Td (4 - Td or Tdap) 01/14/2028   Hepatitis C Screening  Completed   HIV Screening  Completed   Zoster Vaccines- Shingrix  Completed   Pneumococcal Vaccine 67-71 Years old  Aged Out   HPV VACCINES  Aged Out    Discussed health benefits of physical activity, and encouraged her to engage in  regular exercise appropriate for her age and condition.  Problem List Items Addressed This Visit   None Visit Diagnoses     Wellness examination    -  Primary   Relevant Orders   Lipid Panel w/reflex Direct LDL   CBC   COMPLETE METABOLIC PANEL WITH GFR   TSH       Keep up a regular exercise program and make sure you are eating a healthy diet Try to eat 4 servings of dairy a day, or if you are lactose intolerant take a calcium with vitamin D daily.  Your vaccines are up to date.   No follow-ups on file.     Nani Gasser, MD

## 2022-10-16 LAB — CBC
HCT: 44.1 % (ref 35.0–45.0)
Hemoglobin: 14.7 g/dL (ref 11.7–15.5)
MCH: 31.5 pg (ref 27.0–33.0)
MCHC: 33.3 g/dL (ref 32.0–36.0)
MCV: 94.4 fL (ref 80.0–100.0)
MPV: 10.9 fL (ref 7.5–12.5)
Platelets: 178 10*3/uL (ref 140–400)
RBC: 4.67 10*6/uL (ref 3.80–5.10)
RDW: 11.9 % (ref 11.0–15.0)
WBC: 5 10*3/uL (ref 3.8–10.8)

## 2022-10-16 LAB — TSH: TSH: 2.22 mIU/L (ref 0.40–4.50)

## 2022-10-16 LAB — COMPLETE METABOLIC PANEL WITH GFR
AG Ratio: 1.4 (calc) (ref 1.0–2.5)
ALT: 17 U/L (ref 6–29)
AST: 19 U/L (ref 10–35)
Albumin: 4.1 g/dL (ref 3.6–5.1)
Alkaline phosphatase (APISO): 103 U/L (ref 37–153)
BUN: 19 mg/dL (ref 7–25)
CO2: 29 mmol/L (ref 20–32)
Calcium: 9.3 mg/dL (ref 8.6–10.4)
Chloride: 102 mmol/L (ref 98–110)
Creat: 0.75 mg/dL (ref 0.50–1.03)
Globulin: 2.9 g/dL (calc) (ref 1.9–3.7)
Glucose, Bld: 89 mg/dL (ref 65–99)
Potassium: 4.5 mmol/L (ref 3.5–5.3)
Sodium: 138 mmol/L (ref 135–146)
Total Bilirubin: 0.7 mg/dL (ref 0.2–1.2)
Total Protein: 7 g/dL (ref 6.1–8.1)
eGFR: 93 mL/min/{1.73_m2} (ref >=60)

## 2022-10-16 LAB — LIPID PANEL W/REFLEX DIRECT LDL
Cholesterol: 218 mg/dL — ABNORMAL HIGH (ref <200)
HDL: 70 mg/dL (ref >=50)
LDL Cholesterol (Calc): 128 mg/dL (calc) — ABNORMAL HIGH
Non-HDL Cholesterol (Calc): 148 mg/dL (calc) — ABNORMAL HIGH (ref <130)
Total CHOL/HDL Ratio: 3.1 (calc) (ref <5.0)
Triglycerides: 98 mg/dL (ref <150)

## 2022-10-17 ENCOUNTER — Encounter: Payer: Self-pay | Admitting: Family Medicine

## 2022-10-17 NOTE — Progress Notes (Signed)
Hi Cynthia Barrett, LDL cholesterol jumped up quite a bit compared to last year.  Continue to work on healthy diet and regular exercise.  Blood count is normal no sign of anemia.  Your metabolic panel and thyroid look great.

## 2022-10-30 ENCOUNTER — Encounter: Payer: 59 | Admitting: Family Medicine

## 2022-12-01 ENCOUNTER — Other Ambulatory Visit: Payer: Self-pay

## 2022-12-01 ENCOUNTER — Encounter: Payer: Self-pay | Admitting: Physical Therapy

## 2022-12-01 ENCOUNTER — Ambulatory Visit: Payer: 59 | Attending: Family Medicine | Admitting: Physical Therapy

## 2022-12-01 DIAGNOSIS — G8929 Other chronic pain: Secondary | ICD-10-CM | POA: Insufficient documentation

## 2022-12-01 DIAGNOSIS — M545 Low back pain, unspecified: Secondary | ICD-10-CM | POA: Diagnosis not present

## 2022-12-01 DIAGNOSIS — M5459 Other low back pain: Secondary | ICD-10-CM | POA: Diagnosis present

## 2022-12-01 DIAGNOSIS — M6281 Muscle weakness (generalized): Secondary | ICD-10-CM | POA: Diagnosis present

## 2022-12-01 NOTE — Therapy (Signed)
OUTPATIENT PHYSICAL THERAPY THORACOLUMBAR EVALUATION  Patient Name: Cynthia Barrett MRN: [id number] DOB:08-26-1965, 57 y.o., female Today's Date: 12/01/2022   PT End of Session - 12/01/22 1001     Visit Number 1    Number of Visits --   1-2x/week   Date for PT Re-Evaluation 01/26/23    Authorization Type [employer] - TAKE FOTO VISIT 2    PT Start Time 0900    PT Stop Time 0945    PT Time Calculation (min) 45 min             Past Medical History:  Diagnosis Date   Anxiety    Depression    Past Surgical History:  Procedure Laterality Date   TOTAL LAPAROSCOPIC HYSTERECTOMY WITH SALPINGECTOMY  2022   wFU   TUBAL LIGATION  2004   tummy tuck  10/2004   Patient Active Problem List   Diagnosis Date Noted   ADHD, 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 No, Number of falls: 0  MOI/History of condition:  Onset date: >2 years  SUBJECTIVE STATEMENT  Cynthia Barrett is a 57 y.o. female who presents to clinic with chief complaint of R>L low back pain.  She reports onset of no acute injury or trauma which has slowly worsened over the last 2 years.  The pain is worse with activity, particularly involving rotation.  She denies radicular sxs or weakness.   Red flags:  denies   Pain:  Are you having pain? Yes Pain location: R>L low back pain NPRS scale:  0/10 to 5/10 Aggravating factors: rotation or sitting for long periods Relieving  factors: laying flat or standing Pain description: sharp and aching Stage: Chronic 24 hour pattern: worse with activity   Occupation: works at [employer] (walking constantly)  Assistive Device: NA  Hand Dominance: NA  Patient Goals/Specific Activities: Reduce pain, get exercise problem to manage.   OBJECTIVE:   DIAGNOSTIC FINDINGS:   X-Ray Multilevel moderate degenerative disc disease and facet arthropathy, findings have progressed when compared with the prior exam.  GENERAL OBSERVATION/GAIT:  Significant difficult with lumbar rotation limiting bed mobility  SENSATION:  Light touch: Appears intact  LUMBAR AROM  AROM AROM  (Eval)  Flexion Fingertips to mid shin  Extension limited by 25%  Right lateral flexion limited by 75%, w/ concordant pain (SI?  Left lateral flexion limited by 25%  Right rotation limited by 50%, w/ concordant pain  Left rotation limited by 25%    (Blank rows = not tested)   LE MMT:  MMT Right (Eval) Left (Eval)  Hip flexion (L2, L3) 4 4  Knee extension (L3) 4+ 4+  Knee flexion 4+ 4+  Hip abduction 4 4  Hip extension 4+ 3-*  Hip external rotation    Hip internal rotation    Hip adduction    Ankle dorsiflexion (L4)    Ankle plantarflexion (S1)    Ankle inversion    Ankle eversion  Great Toe ext (L5)    Grossly     (Blank rows = not tested, score listed is out of 5 possible points.  N = WNL, D = diminished, C = clear for gross weakness with myotome testing, * = concordant pain with testing)   MUSCLE LENGTH: Hamstrings: Right no restriction; Left no restriction Cynthia Barrett test: Right no restriction; Left no restriction   LE ROM:  ROM Right (Eval) Left (Eval)  Hip flexion N* N*  Hip extension    Hip abduction    Hip adduction    Hip internal rotation    Hip external rotation limited limited  Knee flexion    Knee extension    Ankle dorsiflexion    Ankle plantarflexion    Ankle inversion    Ankle eversion       (Blank rows = not tested, N = WNL, * = concordant pain with testing)  Functional Tests  Eval (12/01/2022)                                                                PALPATION:   PA mobs lumbar spine cause R sided pain  Cluster of [place] (2/4 tests considered +)  Distraction: NT Thigh thrust: negative Compression (S/L): negative Sacral thrust: negative   PATIENT SURVEYS:  FOTO TAKE VISIT 2   TODAY'S TREATMENT  Creating, reviewing, and completing below HEP   PATIENT EDUCATION:  POC, diagnosis, prognosis, HEP, and outcome measures.  Pt educated via explanation, demonstration, and handout (HEP).  Pt confirms understanding verbally.   HOME EXERCISE PROGRAM: Access Code: [email or web address] URL: [email or web address] Date: 12/01/2022 Prepared by: Cynthia Barrett  Exercises - Supine Double Knee to Chest  - 2 x daily - 7 x weekly - 1 sets - 2 reps - 1 min hold - Supine Lower Trunk Rotation  - 1 x daily - 7 x weekly - 1 sets - 20 reps - 3 hold - Hooklying Isometric Clamshell  - 1 x daily - 7 x weekly - 3 sets - 10 reps - Supine Hip Adduction Isometric with Ball  - 1 x daily - 7 x weekly - 2 sets - 10 reps - 10'' hold  Treatment priorities   Eval        R Rotation, R S/B        L hip ext strength        Lumbar flexion        Bil hip ER        TAKE FOTO VISIT 2, CHECK PIRIFORMIS          ASSESSMENT:  CLINICAL IMPRESSION: Cynthia Barrett is a 57 y.o. female who presents to clinic with signs and sxs consistent with mechanical low back pain.  R SIJ dysfunction considered but ruled down with (-) test cluster; unable to completely rule out d/t pain location.  Pt is limited by pain in bed mobility, housework, and recreation and will benefit from skilled PT to improve participation in these activities  OBJECTIVE IMPAIRMENTS: Pain, hip mobility, lumbar mobility, hip and core strength  ACTIVITY LIMITATIONS: bending, lifting, transfers, bed mobility,  housework  PERSONAL FACTORS: See medical history and pertinent history   REHAB POTENTIAL: Good  CLINICAL DECISION MAKING: Stable/uncomplicated  EVALUATION COMPLEXITY: Low   GOALS:   SHORT  TERM GOALS: Target date: 12/29/2022   Cynthia Barrett will be >75% HEP compliant to improve carryover between sessions and facilitate independent management of condition  Evaluation: ongoing Goal status: INITIAL   LONG TERM GOALS: Target date: 01/26/2023   Marliene will improve FOTO score to goal as a proxy for functional improvement  Evaluation/Baseline: TAKE VISIT 2 Goal status: INITIAL    2.  Preesha will self report >/= 50% decrease in pain from evaluation   Evaluation/Baseline: 5/10 max pain Goal status: INITIAL   3.  Paying will report confidence in self management of condition at time of discharge with advanced HEP  Evaluation/Baseline: unable to self manage Goal status: INITIAL   4.  Krissi will self report significant improvement in mobility  Evaluation/Baseline: limited and painful Goal status: INITIAL    PLAN: PT FREQUENCY: 1-2x/week  PT DURATION: 8 weeks  PLANNED INTERVENTIONS: Therapeutic exercises, Aquatic therapy, Therapeutic activity, Neuro Muscular re-education, Gait training, Patient/Family education, Joint mobilization, Dry Needling, Electrical stimulation, Spinal mobilization and/or manipulation, Moist heat, Taping, Vasopneumatic device, Ionotophoresis 4mg /ml Dexamethasone, and Manual therapy   Cynthia Barrett PT, DPT 12/01/2022, 10:05 AM

## 2022-12-11 ENCOUNTER — Encounter: Payer: Self-pay | Admitting: Physical Therapy

## 2022-12-11 ENCOUNTER — Ambulatory Visit: Payer: 59 | Admitting: Physical Therapy

## 2022-12-11 DIAGNOSIS — M5459 Other low back pain: Secondary | ICD-10-CM

## 2022-12-11 DIAGNOSIS — M6281 Muscle weakness (generalized): Secondary | ICD-10-CM

## 2022-12-11 NOTE — Therapy (Signed)
Daily Note  Patient Name: Cynthia Barrett MRN: [id number] DOB:14-May-1965, 57 y.o., female Today's Date: 12/11/2022   PT End of Session - 12/11/22 1612     Visit Number 2    Number of Visits --   1-2x/week   Date for PT Re-Evaluation 01/26/23    Authorization Type U[employer] - TAKE FOTO VISIT 2    PT Start Time 1615    PT Stop Time 1657    PT Time Calculation (min) 42 min             Past Medical History:  Diagnosis Date   Anxiety    Depression    Past Surgical History:  Procedure Laterality Date   TOTAL LAPAROSCOPIC HYSTERECTOMY WITH SALPINGECTOMY  2022   wFU   TUBAL LIGATION  2004   tummy tuck  10/2004   Patient Active Problem List   Diagnosis Date Noted   ADHD, 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 No, Number of falls: 0  MOI/History of condition:  Onset date: >2 years  SUBJECTIVE STATEMENT  Pt reports that she has been HEP compliant.  Pt reports she does not feel like the exercises are helpful so far.   Red flags:  denies   Pain:  Are you having pain? Yes Pain location: R>L low back pain NPRS scale:  0/10 to 5/10 Aggravating factors: rotation or sitting for long periods Relieving factors: laying flat or standing Pain description: sharp and aching Stage: Chronic 24 hour pattern: worse with activity   Occupation: works at [employer] (walking constantly)  Assistive Device: NA  Hand Dominance:  NA  Patient Goals/Specific Activities: Reduce pain, get exercise problem to manage.   OBJECTIVE:   DIAGNOSTIC FINDINGS:   X-Ray Multilevel moderate degenerative disc disease and facet arthropathy, findings have progressed when compared with the prior exam.  GENERAL OBSERVATION/GAIT:  Significant difficult with lumbar rotation limiting bed mobility  SENSATION:  Light touch: Appears intact  LUMBAR AROM  AROM AROM  (Eval)  Flexion Fingertips to mid shin  Extension limited by 25%  Right lateral flexion limited by 75%, w/ concordant pain (SI?  Left lateral flexion limited by 25%  Right rotation limited by 50%, w/ concordant pain  Left rotation limited by 25%    (Blank rows = not tested)   LE MMT:  MMT Right (Eval) Left (Eval)  Hip flexion (L2, L3) 4 4  Knee extension (L3) 4+ 4+  Knee flexion 4+ 4+  Hip abduction 4 4  Hip extension 4+ 3-*  Hip external rotation    Hip internal rotation    Hip adduction    Ankle dorsiflexion (L4)    Ankle plantarflexion (S1)    Ankle inversion    Ankle eversion    Great Toe ext (L5)    Grossly     (Blank rows = not tested, score listed is out of 5 possible points.  N = WNL, D = diminished, C = clear for gross  weakness with myotome testing, * = concordant pain with testing)   MUSCLE LENGTH: Hamstrings: Right no restriction; Left no restriction [name] test: Right no restriction; Left no restriction   LE ROM:  ROM Right (Eval) Left (Eval)  Hip flexion N* N*  Hip extension    Hip abduction    Hip adduction    Hip internal rotation    Hip external rotation limited limited  Knee flexion    Knee extension    Ankle dorsiflexion    Ankle plantarflexion    Ankle inversion    Ankle eversion      (Blank rows = not tested, N = WNL, * = concordant pain with testing)  Functional Tests  Eval (12/11/2022)                                                                PALPATION:   PA mobs lumbar spine cause R  sided pain  Cluster of Laslett (2/4 tests considered +)  Distraction: NT Thigh thrust: negative Compression (S/L): negative Sacral thrust: negative   PATIENT SURVEYS:  FOTO TAKE VISIT 2   TODAY'S TREATMENT  Creating, reviewing, and completing below HEP   PATIENT EDUCATION:  POC, diagnosis, prognosis, HEP, and outcome measures.  Pt educated via explanation, demonstration, and handout (HEP).  Pt confirms understanding verbally.   HOME EXERCISE PROGRAM: Access Code: [email or web address] URL: [email or web address] Date: 12/11/2022 Prepared by: Alphonzo Severance  Exercises - Supine Hip Adduction Isometric with Ball  - 1 x daily - 7 x weekly - 2 sets - 10 reps - 10'' hold - Clamshell with Resistance  - 1 x daily - 7 x weekly - 3 sets - 10 reps - Supine Bridge with Pelvic Floor Contraction on Swiss Ball  - 1 x daily - 7 x weekly - 3 sets - 10 reps - Supine Knees to Chest with Swiss Ball  - 1 x daily - 7 x weekly - 3 sets - 10 reps - Supine Lower Trunk Rotation with Swiss Ball  - 1 x daily - 7 x weekly - 3 sets - 10 reps  Treatment priorities   Eval        R Rotation, R S/B        L hip ext strength        Lumbar flexion        Bil hip ER        CHECK PIRIFORMIS         TODAY'S TREATMENT   [facility] Adult PT Treatment:  Therapeutic Exercise: nu-step L5 55m while taking subjective and planning session with patient Lumbar ball roll outs - 10x L/R/C Alternating SLR from foam roller - 2x10 LTR with pball DKTC with pball Bridge on pball - 2x10 S/L clam - RTB - 2x10 Standing bird dog at counter - 10x    ASSESSMENT:  CLINICAL IMPRESSION: Alantra tolerated session fair with no adverse reaction. She is somewhat limited by pain throughout.  She is able to complete all listed exercises with minimal increase in baseline pain.  We were able to progress core and hip exercises and update HEP.  Pt was more comfortable in lumbar flexed positions compared to neutral.   OBJECTIVE  IMPAIRMENTS: Pain, hip mobility, lumbar mobility, hip and core strength  ACTIVITY  LIMITATIONS: bending, lifting, transfers, bed mobility, housework  PERSONAL FACTORS: See medical history and pertinent history   REHAB POTENTIAL: Good  CLINICAL DECISION MAKING: Stable/uncomplicated  EVALUATION COMPLEXITY: Low   GOALS:   SHORT TERM GOALS: Target date: 12/29/2022   Donovan will be >75% HEP compliant to improve carryover between sessions and facilitate independent management of condition  Evaluation: ongoing Goal status: INITIAL   LONG TERM GOALS: Target date: 01/26/2023   Tyreonna will improve FOTO score to goal as a proxy for functional improvement  Evaluation/Baseline: TAKE VISIT 2 Goal status: INITIAL    2.  Dorette will self report >/= 50% decrease in pain from evaluation   Evaluation/Baseline: 5/10 max pain Goal status: INITIAL   3.  Emmarose will report confidence in self management of condition at time of discharge with advanced HEP  Evaluation/Baseline: unable to self manage Goal status: INITIAL   4.  Cyonna will self report significant improvement in mobility  Evaluation/Baseline: limited and painful Goal status: INITIAL    PLAN: PT FREQUENCY: 1-2x/week  PT DURATION: 8 weeks  PLANNED INTERVENTIONS: Therapeutic exercises, Aquatic therapy, Therapeutic activity, Neuro Muscular re-education, Gait training, Patient/Family education, Joint mobilization, Dry Needling, Electrical stimulation, Spinal mobilization and/or manipulation, Moist heat, Taping, Vasopneumatic device, Ionotophoresis 4mg /ml Dexamethasone, and Manual therapy   Alphonzo Severance PT, DPT 12/11/2022, 5:02 PM

## 2022-12-14 NOTE — Therapy (Signed)
Daily Note  Patient Name: Cynthia Barrett MRN: [id number] DOB:May 08, 1965, 57 y.o., female Today's Date: 12/15/2022   PT End of Session - 12/15/22 0855     Visit Number 3    Date for PT Re-Evaluation 01/26/23    Authorization Type UHC    PT Start Time 0900    PT Stop Time 0940    PT Time Calculation (min) 40 min    Activity Tolerance Patient tolerated treatment well    Behavior During Therapy [facility] for tasks assessed/performed              Past Medical History:  Diagnosis Date   Anxiety    Depression    Past Surgical History:  Procedure Laterality Date   TOTAL LAPAROSCOPIC HYSTERECTOMY WITH SALPINGECTOMY  2022   wFU   TUBAL LIGATION  2004   tummy tuck  10/2004   Patient Active Problem List   Diagnosis Date Noted   ADHD, 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 No, Number of falls: 0  MOI/History of condition:  Onset date: >2 years  SUBJECTIVE STATEMENT Patient reports that her pain only increases after about 5+ hours of working.   Red flags:  denies   Pain:  Are you having pain? Yes Pain location: R>L low back pain NPRS scale:  0/10 to 5/10 Aggravating factors: rotation or sitting for long periods Relieving factors: laying flat or standing Pain description: sharp and aching Stage: Chronic 24 hour pattern: worse with activity   Occupation: works at [employer] (walking constantly)  Assistive Device:  NA  Hand Dominance: NA  Patient Goals/Specific Activities: Reduce pain, get exercise problem to manage.   OBJECTIVE:   DIAGNOSTIC FINDINGS:   X-Ray Multilevel moderate degenerative disc disease and facet arthropathy, findings have progressed when compared with the prior exam.  GENERAL OBSERVATION/GAIT:  Significant difficult with lumbar rotation limiting bed mobility  SENSATION:  Light touch: Appears intact  LUMBAR AROM  AROM AROM  (Eval)  Flexion Fingertips to mid shin  Extension limited by 25%  Right lateral flexion limited by 75%, w/ concordant pain (SI?  Left lateral flexion limited by 25%  Right rotation limited by 50%, w/ concordant pain  Left rotation limited by 25%    (Blank rows = not tested)   LE MMT:  MMT Right (Eval) Left (Eval)  Hip flexion (L2, L3) 4 4  Knee extension (L3) 4+ 4+  Knee flexion 4+ 4+  Hip abduction 4 4  Hip extension 4+ 3-*  Hip external rotation    Hip internal rotation    Hip adduction    Ankle dorsiflexion (L4)    Ankle plantarflexion (S1)    Ankle inversion    Ankle eversion    Great Toe ext (L5)    Grossly     (Blank rows = not tested, score listed is out of 5 possible points.  N = WNL, D = diminished, C = clear for gross weakness with myotome testing, * =  concordant pain with testing)   MUSCLE LENGTH: Hamstrings: Right no restriction; Left no restriction Cynthia Barrett test: Right no restriction; Left no restriction   LE ROM:  ROM Right (Eval) Left (Eval)  Hip flexion N* N*  Hip extension    Hip abduction    Hip adduction    Hip internal rotation    Hip external rotation limited limited  Knee flexion    Knee extension    Ankle dorsiflexion    Ankle plantarflexion    Ankle inversion    Ankle eversion      (Blank rows = not tested, N = WNL, * = concordant pain with testing)  Functional Tests  Eval                                                                PALPATION:   PA mobs lumbar  spine cause R sided pain  Cluster of Laslett (2/4 tests considered +)  Distraction: NT Thigh thrust: negative Compression (S/L): negative Sacral thrust: negative   PATIENT SURVEYS:  FOTO TAKE VISIT 2 64%   TODAY'S TREATMENT  Creating, reviewing, and completing below HEP   PATIENT EDUCATION:  POC, diagnosis, prognosis, HEP, and outcome measures.  Pt educated via explanation, demonstration, and handout (HEP).  Pt confirms understanding verbally.   HOME EXERCISE PROGRAM: Access Code: [email or web address] URL: [email or web address] Date: 12/11/2022 Prepared by: Alphonzo Severance  Exercises - Supine Hip Adduction Isometric with Ball  - 1 x daily - 7 x weekly - 2 sets - 10 reps - 10'' hold - Clamshell with Resistance  - 1 x daily - 7 x weekly - 3 sets - 10 reps - Supine Bridge with Pelvic Floor Contraction on Swiss Ball  - 1 x daily - 7 x weekly - 3 sets - 10 reps - Supine Knees to Chest with Swiss Ball  - 1 x daily - 7 x weekly - 3 sets - 10 reps - Supine Lower Trunk Rotation with Swiss Ball  - 1 x daily - 7 x weekly - 3 sets - 10 reps  Treatment priorities   Eval        R Rotation, R S/B        L hip ext strength        Lumbar flexion        Bil hip ER        CHECK PIRIFORMIS         TODAY'S TREATMENT  [facility] Adult PT Treatment:                                                DATE: 12/15/22 Therapeutic Exercise: nu-step L5 2m while taking subjective and planning session with patient Seated horizontal abduction RTB 2x10 Seated BIL ER with scap retraction RTB 2x10 Lumbar ball roll outs - 10x L/R/C SLR with contralateral pilates ring push 2x10 BIL LTR with pball x10 DKTC with pball x10 Bridge on pball - 2x10 Standing bird dog at counter - 10x2   [facility] Adult PT Treatment:  Therapeutic Exercise: nu-step L5 70m while taking subjective and planning session with patient Lumbar ball roll outs - 10x L/R/C  Alternating SLR from foam roller - 2x10 LTR with pball DKTC with  pball Bridge on pball - 2x10 S/L clam - RTB - 2x10 Standing bird dog at counter - 10x    ASSESSMENT:  CLINICAL IMPRESSION: Patient presents to PT reporting continued lower back pain when she is working, stating that her pain begins to increase after 5+ hours of work. Session today continued to focus on core and proximal hip strengthening with increased resistance as noted above. Patient was able to tolerate all prescribed exercises with no adverse effects. Patient continues to benefit from skilled PT services and should be progressed as able to improve functional independence.    OBJECTIVE IMPAIRMENTS: Pain, hip mobility, lumbar mobility, hip and core strength  ACTIVITY LIMITATIONS: bending, lifting, transfers, bed mobility, housework  PERSONAL FACTORS: See medical history and pertinent history   REHAB POTENTIAL: Good  CLINICAL DECISION MAKING: Stable/uncomplicated  EVALUATION COMPLEXITY: Low   GOALS:   SHORT TERM GOALS: Target date: 12/29/2022   Cynthia Barrett will be >75% HEP compliant to improve carryover between sessions and facilitate independent management of condition  Evaluation: ongoing Goal status: INITIAL   LONG TERM GOALS: Target date: 01/26/2023   Cynthia Barrett will improve FOTO score to goal as a proxy for functional improvement  Evaluation/Baseline: 64% Goal status: INITIAL    2.  Cynthia Barrett will self report >/= 50% decrease in pain from evaluation   Evaluation/Baseline: 5/10 max pain Goal status: INITIAL   3.  Cynthia Barrett will report confidence in self management of condition at time of discharge with advanced HEP  Evaluation/Baseline: unable to self manage Goal status: INITIAL   4.  Cynthia Barrett will self report significant improvement in mobility  Evaluation/Baseline: limited and painful Goal status: INITIAL    PLAN: PT FREQUENCY: 1-2x/week  PT DURATION: 8 weeks  PLANNED INTERVENTIONS: Therapeutic exercises, Aquatic therapy, Therapeutic activity, Neuro Muscular  re-education, Gait training, Patient/Family education, Joint mobilization, Dry Needling, Electrical stimulation, Spinal mobilization and/or manipulation, Moist heat, Taping, Vasopneumatic device, Ionotophoresis 4mg /ml Dexamethasone, and Manual therapy   Berta Minor PTA 12/15/2022, 9:39 AM

## 2022-12-15 ENCOUNTER — Ambulatory Visit: Payer: 59

## 2022-12-15 DIAGNOSIS — M5459 Other low back pain: Secondary | ICD-10-CM

## 2022-12-15 DIAGNOSIS — M6281 Muscle weakness (generalized): Secondary | ICD-10-CM

## 2022-12-18 ENCOUNTER — Encounter: Payer: Self-pay | Admitting: Physical Therapy

## 2022-12-18 ENCOUNTER — Ambulatory Visit: Payer: 59 | Admitting: Physical Therapy

## 2022-12-18 DIAGNOSIS — M6281 Muscle weakness (generalized): Secondary | ICD-10-CM

## 2022-12-18 DIAGNOSIS — M5459 Other low back pain: Secondary | ICD-10-CM

## 2022-12-18 NOTE — Therapy (Signed)
Daily Note  Patient Name: Cynthia Barrett MRN: [id number] DOB:[birth year], 57 y.o., female Today's Date: 12/18/2022   PT End of Session - 12/18/22 1610     Visit Number 4    Date for PT Re-Evaluation 01/26/23    Authorization Type UHC    PT Start Time 1615    PT Stop Time 1656    PT Time Calculation (min) 41 min    Activity Tolerance Patient tolerated treatment well    Behavior During Therapy [facility] for tasks assessed/performed              Past Medical History:  Diagnosis Date   Anxiety    Depression    Past Surgical History:  Procedure Laterality Date   TOTAL LAPAROSCOPIC HYSTERECTOMY WITH SALPINGECTOMY  2022   wFU   TUBAL LIGATION  2004   tummy tuck  10/2004   Patient Active Problem List   Diagnosis Date Noted   ADHD, 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 No, Number of falls: 0  MOI/History of condition:  Onset date: >2 years  SUBJECTIVE STATEMENT Pt reports that she is having higher levels of significant pain yesterday and today.  This has no clear cause.   Red flags:  denies   Pain:  Are you having pain? Yes Pain location: R>L low back pain NPRS scale:  0/10 to 5/10 Aggravating factors: rotation or sitting for long periods Relieving factors: laying flat or standing Pain description: sharp and aching Stage: Chronic 24 hour pattern: worse with activity   Occupation: works at [employer] (walking  constantly)  Assistive Device: NA  Hand Dominance: NA  Patient Goals/Specific Activities: Reduce pain, get exercise problem to manage.   OBJECTIVE:   DIAGNOSTIC FINDINGS:   X-Ray Multilevel moderate degenerative disc disease and facet arthropathy, findings have progressed when compared with the prior exam.  GENERAL OBSERVATION/GAIT:  Significant difficult with lumbar rotation limiting bed mobility  SENSATION:  Light touch: Appears intact  LUMBAR AROM  AROM AROM  (Eval)  Flexion Fingertips to mid shin  Extension limited by 25%  Right lateral flexion limited by 75%, w/ concordant pain (SI?  Left lateral flexion limited by 25%  Right rotation limited by 50%, w/ concordant pain  Left rotation limited by 25%    (Blank rows = not tested)   LE MMT:  MMT Right (Eval) Left (Eval)  Hip flexion (L2, L3) 4 4  Knee extension (L3) 4+ 4+  Knee flexion 4+ 4+  Hip abduction 4 4  Hip extension 4+ 3-*  Hip external rotation    Hip internal rotation    Hip adduction    Ankle dorsiflexion (L4)    Ankle plantarflexion (S1)    Ankle inversion    Ankle eversion    Great Toe ext (L5)    Grossly     (Blank rows = not tested, score listed is out of 5 possible points.  N = WNL, D = diminished, C =  clear for gross weakness with myotome testing, * = concordant pain with testing)   MUSCLE LENGTH: Hamstrings: Right no restriction; Left no restriction [name] test: Right no restriction; Left no restriction   LE ROM:  ROM Right (Eval) Left (Eval)  Hip flexion N* N*  Hip extension    Hip abduction    Hip adduction    Hip internal rotation    Hip external rotation limited limited  Knee flexion    Knee extension    Ankle dorsiflexion    Ankle plantarflexion    Ankle inversion    Ankle eversion      (Blank rows = not tested, N = WNL, * = concordant pain with testing)  Functional Tests  Eval                                                                 PALPATION:   PA mobs lumbar spine cause R sided pain  Cluster of Laslett (2/4 tests considered +)  Distraction: NT Thigh thrust: negative Compression (S/L): negative Sacral thrust: negative   PATIENT SURVEYS:  FOTO TAKE VISIT 2 64%   TODAY'S TREATMENT  Creating, reviewing, and completing below HEP   PATIENT EDUCATION:  POC, diagnosis, prognosis, HEP, and outcome measures.  Pt educated via explanation, demonstration, and handout (HEP).  Pt confirms understanding verbally.   HOME EXERCISE PROGRAM: Access Code: [email or web address] URL: [email or web address] Date: 12/11/2022 Prepared by: Alphonzo Severance  Exercises - Supine Hip Adduction Isometric with Ball  - 1 x daily - 7 x weekly - 2 sets - 10 reps - 10'' hold - Clamshell with Resistance  - 1 x daily - 7 x weekly - 3 sets - 10 reps - Supine Bridge with Pelvic Floor Contraction on Swiss Ball  - 1 x daily - 7 x weekly - 3 sets - 10 reps - Supine Knees to Chest with Swiss Ball  - 1 x daily - 7 x weekly - 3 sets - 10 reps - Supine Lower Trunk Rotation with Swiss Ball  - 1 x daily - 7 x weekly - 3 sets - 10 reps  Treatment priorities   Eval        R Rotation, R S/B        L hip ext strength        Lumbar flexion        Bil hip ER        CHECK PIRIFORMIS         TODAY'S TREATMENT  [facility] Adult PT Treatment:                                                DATE: 12/18/22 Therapeutic Exercise: nu-step L5 28m while taking subjective and planning session with patient Lumbar ball roll outs - 10x L/R/C S/L clam with RTB - 2x10 STS - with bar support - 3x10 Thigh slide - 2x10 L lateral shift correction at wall - D/C d/t pain Row with lumbar ext - 13# - 2x10 LTR with pball x10 DKTC with pball x10 Bridge on pball - 2x10 Paloff press - GTB - 2x10    [facility] Adult  PT Treatment:  Therapeutic Exercise: nu-step L5 33m while taking subjective and planning session with patient Lumbar ball roll outs - 10x L/R/C Alternating SLR  from foam roller - 2x10 LTR with pball DKTC with pball Bridge on pball - 2x10 S/L clam - RTB - 2x10 Standing bird dog at counter - 10x    ASSESSMENT:  CLINICAL IMPRESSION: Pt presents to PT with continued pain complaint in L>R low back.  Therex modified appropriately to avoid significant increase in pain.  Focused more on compound movements including sit to stand today and updated HEP accordingly.  Lateral shift correction was agging and d/c'd.   OBJECTIVE IMPAIRMENTS: Pain, hip mobility, lumbar mobility, hip and core strength  ACTIVITY LIMITATIONS: bending, lifting, transfers, bed mobility, housework  PERSONAL FACTORS: See medical history and pertinent history   REHAB POTENTIAL: Good  CLINICAL DECISION MAKING: Stable/uncomplicated  EVALUATION COMPLEXITY: Low   GOALS:   SHORT TERM GOALS: Target date: 12/29/2022   Nahal will be >75% HEP compliant to improve carryover between sessions and facilitate independent management of condition  Evaluation: ongoing Goal status: INITIAL   LONG TERM GOALS: Target date: 01/26/2023   Javionna will improve FOTO score to goal as a proxy for functional improvement  Evaluation/Baseline: 64% Goal status: INITIAL    2.  Quinlee will self report >/= 50% decrease in pain from evaluation   Evaluation/Baseline: 5/10 max pain Goal status: INITIAL   3.  Bobby will report confidence in self management of condition at time of discharge with advanced HEP  Evaluation/Baseline: unable to self manage Goal status: INITIAL   4.  Loa will self report significant improvement in mobility  Evaluation/Baseline: limited and painful Goal status: INITIAL    PLAN: PT FREQUENCY: 1-2x/week  PT DURATION: 8 weeks  PLANNED INTERVENTIONS: Therapeutic exercises, Aquatic therapy, Therapeutic activity, Neuro Muscular re-education, Gait training, Patient/Family education, Joint mobilization, Dry Needling, Electrical stimulation, Spinal mobilization  and/or manipulation, Moist heat, Taping, Vasopneumatic device, Ionotophoresis 4mg /ml Dexamethasone, and Manual therapy   Kimberlee Nearing Camila Maita PT 12/18/2022, 5:37 PM

## 2022-12-19 NOTE — Therapy (Signed)
Daily Note  Patient Name: Cynthia Barrett MRN: [id number] DOB:03-26-1966, 57 y.o., female Today's Date: 12/20/2022   PT End of Session - 12/20/22 1615     Visit Number 5    Date for PT Re-Evaluation 01/26/23    Authorization Type U[employer]    PT Start Time 1616    PT Stop Time 1658    PT Time Calculation (min) 42 min    Activity Tolerance Patient tolerated treatment well    Behavior During Therapy [facility] for tasks assessed/performed               Past Medical History:  Diagnosis Date   Anxiety    Depression    Past Surgical History:  Procedure Laterality Date   TOTAL LAPAROSCOPIC HYSTERECTOMY WITH SALPINGECTOMY  2022   wFU   TUBAL LIGATION  2004   tummy tuck  10/2004   Patient Active Problem List   Diagnosis Date Noted   ADHD, 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 No, Number of falls: 0  MOI/History of condition:  Onset date: >2 years  SUBJECTIVE STATEMENT  Patient reports that she has been compliant with home exercise program without adverse effect.  States that she still has difficulty/pain with supine glute bridges.   Red flags:  denies   Pain:  Are you having pain? Yes Pain location: R>L low back pain NPRS scale:  0/10 to 5/10 Aggravating factors: rotation or sitting for long periods Relieving factors: laying flat or standing Pain description: sharp and aching Stage: Chronic 24 hour pattern: worse with  activity   Occupation: works at [employer] (walking constantly)  Assistive Device: NA  Hand Dominance: NA  Patient Goals/Specific Activities: Reduce pain, get exercise problem to manage.   OBJECTIVE:   DIAGNOSTIC FINDINGS:   X-Ray Multilevel moderate degenerative disc disease and facet arthropathy, Findings have progressed when compared with the prior exam.  GENERAL OBSERVATION/GAIT:  Significant difficult with lumbar rotation limiting bed mobility  SENSATION:  Light touch: Appears intact  LUMBAR AROM  AROM AROM  (Eval)  Flexion Fingertips to mid shin  Extension limited by 25%  Right lateral flexion limited by 75%, w/ concordant pain (SI?  Left lateral flexion limited by 25%  Right rotation limited by 50%, w/ concordant pain  Left rotation limited by 25%    (Blank rows = not tested)   LE MMT:  MMT Right (Eval) Left (Eval)  Hip flexion (L2, L3) 4 4  Knee extension (L3) 4+ 4+  Knee flexion 4+ 4+  Hip abduction 4 4  Hip extension 4+ 3-*  Hip external rotation    Hip internal rotation    Hip adduction    Ankle dorsiflexion (L4)    Ankle plantarflexion (S1)    Ankle inversion    Ankle eversion    Great Toe ext (L5)    Grossly     (Blank rows = not tested, score listed is out of 5 possible points.  N =  WNL, D = diminished, C = clear for gross weakness with myotome testing, * = concordant pain with testing)   MUSCLE LENGTH: Hamstrings: Right no restriction; Left no restriction [name] test: Right no restriction; Left no restriction   LE ROM:  ROM Right (Eval) Left (Eval)  Hip flexion N* N*  Hip extension    Hip abduction    Hip adduction    Hip internal rotation    Hip external rotation limited limited  Knee flexion    Knee extension    Ankle dorsiflexion    Ankle plantarflexion    Ankle inversion    Ankle eversion      (Blank rows = not tested, N = WNL, * = concordant pain with testing)  Functional Tests  Eval                                                                 PALPATION:   PA mobs lumbar spine cause R sided pain  Cluster of Laslett (2/4 tests considered +)  Distraction: NT Thigh thrust: negative Compression (S/L): negative Sacral thrust: negative   PATIENT SURVEYS:  FOTO TAKE VISIT 2 64%   TODAY'S TREATMENT  Creating, reviewing, and completing below HEP   PATIENT EDUCATION:  POC, diagnosis, prognosis, HEP, and outcome measures.  Pt educated via explanation, demonstration, and handout (HEP).  Pt confirms understanding verbally.   HOME EXERCISE PROGRAM: Access Code: [email or web address] URL: [email or web address] Date: 12/11/2022 Prepared by: Alphonzo Severance  Exercises - Supine Hip Adduction Isometric with Ball  - 1 x daily - 7 x weekly - 2 sets - 10 reps - 10'' hold - Clamshell with Resistance  - 1 x daily - 7 x weekly - 3 sets - 10 reps - Supine Bridge with Pelvic Floor Contraction on Swiss Ball  - 1 x daily - 7 x weekly - 3 sets - 10 reps - Supine Knees to Chest with Swiss Ball  - 1 x daily - 7 x weekly - 3 sets - 10 reps - Supine Lower Trunk Rotation with Swiss Ball  - 1 x daily - 7 x weekly - 3 sets - 10 reps  Treatment priorities   Eval        R Rotation, R S/B        L hip ext strength        Lumbar flexion        Bil hip ER        CHECK PIRIFORMIS          TODAY'S TREATMENT   [facility] Adult PT Treatment:                                                DATE: 12/20/2022  Therapeutic Exercise: nu-step L4 36m while taking subjective and planning session with patient S/L clam with green TB - 2x10 STS - with bar support - 3x10 Seated lumbar flexion/Thigh slide - 2x10 LTR with pball x10 DKTC with pball x20 Bridge on pball x 15 DL progression, thigh slide against wall x 10, thigh slide with 5# DB BIL x 10, thigh slide + hip extension x 10 with  5# DB BIL  Therapeutic Activity:  Patient education regarding significance of imaging results, glute versus abdominal  activation, use exercise ball at home.    [facility] Adult PT Treatment:                                                DATE: 12/18/22 Therapeutic Exercise: nu-step L5 26m while taking subjective and planning session with patient Lumbar ball roll outs - 10x L/R/C S/L clam with RTB - 2x10 STS - with bar support - 3x10 Thigh slide - 2x10 L lateral shift correction at wall - D/C d/t pain Row with lumbar ext - 13# - 2x10 LTR with pball x10 DKTC with pball x10 Bridge on pball - 2x10 Paloff press - GTB - 2x10  [facility] Adult PT Treatment:  Therapeutic Exercise: nu-step L5 57m while taking subjective and planning session with patient Lumbar ball roll outs - 10x L/R/C Alternating SLR from foam roller - 2x10 LTR with pball DKTC with pball Bridge on pball - 2x10 S/L clam - RTB - 2x10 Standing bird dog at counter - 10x    ASSESSMENT:  CLINICAL IMPRESSION: Patient progressed well through today's treatment session, including initial dead lift progression at wall.  She had improved tolerance of posterior pelvic tilt and hip extension, using cueing for glutes activation > abdominal activation. We will reassess response to today's treatment session and continue progression as tolerated.    OBJECTIVE IMPAIRMENTS: Pain, hip mobility, lumbar mobility, hip and core strength  ACTIVITY LIMITATIONS: bending, lifting, transfers, bed mobility, housework  PERSONAL FACTORS: See medical history and pertinent history   REHAB POTENTIAL: Good  CLINICAL DECISION MAKING: Stable/uncomplicated  EVALUATION COMPLEXITY: Low   GOALS:   SHORT TERM GOALS: Target date: 12/29/2022   Kandace will be >75% HEP compliant to improve carryover between sessions and facilitate independent management of condition  Evaluation: ongoing Goal status: INITIAL   LONG TERM GOALS: Target date: 01/26/2023   Cassidi will improve FOTO score to goal as a proxy for functional improvement  Evaluation/Baseline: 64% Goal status: INITIAL     2.  Kashyra will self report >/= 50% decrease in pain from evaluation   Evaluation/Baseline: 5/10 max pain Goal status: INITIAL   3.  Cedric will report confidence in self management of condition at time of discharge with advanced HEP  Evaluation/Baseline: unable to self manage Goal status: INITIAL   4.  Naamah will self report significant improvement in mobility  Evaluation/Baseline: limited and painful Goal status: INITIAL    PLAN: PT FREQUENCY: 1-2x/week  PT DURATION: 8 weeks  PLANNED INTERVENTIONS: Therapeutic exercises, Aquatic therapy, Therapeutic activity, Neuro Muscular re-education, Gait training, Patient/Family education, Joint mobilization, Dry Needling, Electrical stimulation, Spinal mobilization and/or manipulation, Moist heat, Taping, Vasopneumatic device, Ionotophoresis 4mg /ml Dexamethasone, and Manual therapy   Mauri Reading PT, DPT 12/20/2022, 5:16 PM

## 2022-12-20 ENCOUNTER — Ambulatory Visit: Payer: 59

## 2022-12-20 DIAGNOSIS — M6281 Muscle weakness (generalized): Secondary | ICD-10-CM

## 2022-12-20 DIAGNOSIS — M5459 Other low back pain: Secondary | ICD-10-CM | POA: Diagnosis not present

## 2022-12-25 ENCOUNTER — Ambulatory Visit: Payer: 59 | Attending: Family Medicine

## 2022-12-25 DIAGNOSIS — M5459 Other low back pain: Secondary | ICD-10-CM | POA: Diagnosis present

## 2022-12-25 DIAGNOSIS — M6281 Muscle weakness (generalized): Secondary | ICD-10-CM | POA: Diagnosis present

## 2022-12-25 NOTE — Therapy (Signed)
Daily Note  Patient Name: Cynthia Barrett MRN: [id number] DOB:12/19/1965, 57 y.o., female Today's Date: 12/25/2022   PT End of Session - 12/25/22 1610     Visit Number 6    Date for PT Re-Evaluation 01/26/23    Authorization Type UHC    PT Start Time 1610    PT Stop Time 1650    PT Time Calculation (min) 40 min    Activity Tolerance Patient tolerated treatment well    Behavior During Therapy [facility] for tasks assessed/performed                Past Medical History:  Diagnosis Date   Anxiety    Depression    Past Surgical History:  Procedure Laterality Date   TOTAL LAPAROSCOPIC HYSTERECTOMY WITH SALPINGECTOMY  2022   wFU   TUBAL LIGATION  2004   tummy tuck  10/2004   Patient Active Problem List   Diagnosis Date Noted   ADHD, 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 No, Number of falls: 0  MOI/History of condition:  Onset date: >2 years  SUBJECTIVE STATEMENT   Patient reports that she is now able to work for 4.5-5 hours prior to requiring a seated rest break.    Red flags:  denies   Pain:  Are you having pain? Yes Pain location: R>L low back pain NPRS scale:  0/10 to 5/10 Aggravating factors: rotation or sitting for long periods Relieving factors: laying flat or standing Pain description: sharp and aching Stage: Chronic 24 hour pattern: worse with activity   Occupation: works at [employer] (walking  constantly)  Assistive Device: NA  Hand Dominance: NA  Patient Goals/Specific Activities: Reduce pain, get exercise problem to manage.   OBJECTIVE:   DIAGNOSTIC FINDINGS:   X-Ray Multilevel moderate degenerative disc disease and facet arthropathy, Findings have progressed when compared with the prior exam.  GENERAL OBSERVATION/GAIT:  Significant difficult with lumbar rotation limiting bed mobility  SENSATION:  Light touch: Appears intact  LUMBAR AROM  AROM AROM  (Eval)  Flexion Fingertips to mid shin  Extension limited by 25%  Right lateral flexion limited by 75%, w/ concordant pain (SI?  Left lateral flexion limited by 25%  Right rotation limited by 50%, w/ concordant pain  Left rotation limited by 25%    (Blank rows = not tested)   LE MMT:  MMT Right (Eval) Left (Eval)  Hip flexion (L2, L3) 4 4  Knee extension (L3) 4+ 4+  Knee flexion 4+ 4+  Hip abduction 4 4  Hip extension 4+ 3-*  Hip external rotation    Hip internal rotation    Hip adduction    Ankle dorsiflexion (L4)    Ankle plantarflexion (S1)    Ankle inversion    Ankle eversion    Great Toe ext (L5)    Grossly     (Blank rows = not tested, score listed is out of 5 possible points.  N = WNL, D =  diminished, C = clear for gross weakness with myotome testing, * = concordant pain with testing)   MUSCLE LENGTH: Hamstrings: Right no restriction; Left no restriction [name] test: Right no restriction; Left no restriction   LE ROM:  ROM Right (Eval) Left (Eval)  Hip flexion N* N*  Hip extension    Hip abduction    Hip adduction    Hip internal rotation    Hip external rotation limited limited  Knee flexion    Knee extension    Ankle dorsiflexion    Ankle plantarflexion    Ankle inversion    Ankle eversion      (Blank rows = not tested, N = WNL, * = concordant pain with testing)  Functional Tests  Eval                                                                 PALPATION:   PA mobs lumbar spine cause R sided pain  Cluster of Laslett (2/4 tests considered +)  Distraction: NT Thigh thrust: negative Compression (S/L): negative Sacral thrust: negative   PATIENT SURVEYS:  FOTO TAKE VISIT 2 64%   TODAY'S TREATMENT  Creating, reviewing, and completing below HEP   PATIENT EDUCATION:  POC, diagnosis, prognosis, HEP, and outcome measures.  Pt educated via explanation, demonstration, and handout (HEP).  Pt confirms understanding verbally.   HOME EXERCISE PROGRAM: Access Code: [email or web address] URL: [email or web address] Date: 12/11/2022 Prepared by: Alphonzo Severance  Exercises - Supine Hip Adduction Isometric with Ball  - 1 x daily - 7 x weekly - 2 sets - 10 reps - 10'' hold - Clamshell with Resistance  - 1 x daily - 7 x weekly - 3 sets - 10 reps - Supine Bridge with Pelvic Floor Contraction on Swiss Ball  - 1 x daily - 7 x weekly - 3 sets - 10 reps - Supine Knees to Chest with Swiss Ball  - 1 x daily - 7 x weekly - 3 sets - 10 reps - Supine Lower Trunk Rotation with Swiss Ball  - 1 x daily - 7 x weekly - 3 sets - 10 reps  Treatment priorities   Eval        R Rotation, R S/B        L hip ext strength        Lumbar flexion        Bil hip ER        CHECK PIRIFORMIS          TODAY'S TREATMENT   [facility] Adult PT Treatment:                                                DATE: 12/25/2022  Therapeutic Exercise: nu-step L4 77m while taking subjective and planning session with patient STS - x 10 from low table, 2 x 10 from low table with airex under feet  LTR with pball x10 DKTC with pball x20 Bridge on pball x 15 Seated pball rolls DL progression, thigh slide against wall x 10, thigh slide with 7# DB BIL x 10, thigh slide + hip extension x 10 with 7# DB  BIL  [facility] Adult PT Treatment:                                                DATE: 12/20/2022  Therapeutic Exercise: nu-step L4 25m while taking subjective and planning session with  patient S/L clam with green TB - 2x10 STS - with bar support - 3x10 Seated lumbar flexion/Thigh slide - 2x10 LTR with pball x10 DKTC with pball x20 Bridge on pball x 15 DL progression, thigh slide against wall x 10, thigh slide with 5# DB BIL x 10, thigh slide + hip extension x 10 with 5# DB BIL  [facility] Adult PT Treatment:                                                DATE: 12/18/22 Therapeutic Exercise: nu-step L5 39m while taking subjective and planning session with patient Lumbar ball roll outs - 10x L/R/C S/L clam with RTB - 2x10 STS - with bar support - 3x10 Thigh slide - 2x10 L lateral shift correction at wall - D/C d/t pain Row with lumbar ext - 13# - 2x10 LTR with pball x10 DKTC with pball x10 Bridge on pball - 2x10 Paloff press - GTB - 2x10    ASSESSMENT:  CLINICAL IMPRESSION:  Ismahan tolerated today's session without exacerbation of symptoms. She is demonstrating ongoing difficulty with PPT and initial hip extension from supine position for performance of glute bridges. She was able to perform deadlift progression with increased resistance. We will continue towards established goals as able.     OBJECTIVE IMPAIRMENTS: Pain, hip mobility, lumbar mobility, hip and core strength  ACTIVITY LIMITATIONS: bending, lifting, transfers, bed mobility, housework  PERSONAL FACTORS: See medical history and pertinent history   REHAB POTENTIAL: Good  CLINICAL DECISION MAKING: Stable/uncomplicated  EVALUATION COMPLEXITY: Low   GOALS:   SHORT TERM GOALS: Target date: 12/29/2022   Jatoya will be >75% HEP compliant to improve carryover between sessions and facilitate independent management of condition  Evaluation: ongoing Goal status: INITIAL   LONG TERM GOALS: Target date: 01/26/2023   Genelda will improve FOTO score to goal as a proxy for functional improvement  Evaluation/Baseline: 64% Goal status: INITIAL    2.  Annalis will self report >/= 50% decrease in pain from  evaluation   Evaluation/Baseline: 5/10 max pain Goal status: INITIAL   3.  Ashauna will report confidence in self management of condition at time of discharge with advanced HEP  Evaluation/Baseline: unable to self manage Goal status: INITIAL   4.  Heike will self report significant improvement in mobility  Evaluation/Baseline: limited and painful Goal status: INITIAL    PLAN: PT FREQUENCY: 1-2x/week  PT DURATION: 8 weeks  PLANNED INTERVENTIONS: Therapeutic exercises, Aquatic therapy, Therapeutic activity, Neuro Muscular re-education, Gait training, Patient/Family education, Joint mobilization, Dry Needling, Electrical stimulation, Spinal mobilization and/or manipulation, Moist heat, Taping, Vasopneumatic device, Ionotophoresis 4mg /ml Dexamethasone, and Manual therapy   Mauri Reading PT, DPT 12/26/2022, 11:13 AM

## 2022-12-27 ENCOUNTER — Ambulatory Visit: Payer: 59

## 2022-12-27 DIAGNOSIS — M5459 Other low back pain: Secondary | ICD-10-CM | POA: Diagnosis not present

## 2022-12-27 DIAGNOSIS — M6281 Muscle weakness (generalized): Secondary | ICD-10-CM

## 2022-12-27 NOTE — Therapy (Signed)
Daily Note  Patient Name: Cynthia Barrett MRN: [id number] DOB:[birth year], 57 y.o., female Today's Date: 12/27/2022    PT End of Session - 12/27/22 1615     Visit Number 7    Date for PT Re-Evaluation 01/26/23    Authorization Type [employer]    PT Start Time 1615    PT Stop Time 1655    PT Time Calculation (min) 40 min    Activity Tolerance Patient tolerated treatment well    Behavior During Therapy [facility] for tasks assessed/performed               Past Medical History:  Diagnosis Date   Anxiety    Depression    Past Surgical History:  Procedure Laterality Date   TOTAL LAPAROSCOPIC HYSTERECTOMY WITH SALPINGECTOMY  2022   wFU   TUBAL LIGATION  2004   tummy tuck  10/2004   Patient Active Problem List   Diagnosis Date Noted   ADHD, 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 No, Number of falls: 0  MOI/History of condition:  Onset date: >2 years  SUBJECTIVE STATEMENT   Patient presents to PT after work. She has some pain with turning her back.     Red flags:  denies   Pain:  Are you having pain? Yes Pain location: R>L low back pain NPRS scale:  0/10 to 5/10 Aggravating factors: rotation or sitting for long periods Relieving factors: laying flat or standing Pain description: sharp and aching Stage: Chronic 24 hour pattern: worse with activity   Occupation: works at [employer] (walking constantly)  Assistive  Device: NA  Hand Dominance: NA  Patient Goals/Specific Activities: Reduce pain, get exercise problem to manage.   OBJECTIVE:   DIAGNOSTIC FINDINGS:   X-Ray Multilevel moderate degenerative disc disease and facet arthropathy, Findings have progressed when compared with the prior exam.  GENERAL OBSERVATION/GAIT:  Significant difficult with lumbar rotation limiting bed mobility  SENSATION:  Light touch: Appears intact  LUMBAR AROM  AROM AROM  (Eval)  Flexion Fingertips to mid shin  Extension limited by 25%  Right lateral flexion limited by 75%, w/ concordant pain (SI?  Left lateral flexion limited by 25%  Right rotation limited by 50%, w/ concordant pain  Left rotation limited by 25%    (Blank rows = not tested)   LE MMT:  MMT Right (Eval) Left (Eval)  Hip flexion (L2, L3) 4 4  Knee extension (L3) 4+ 4+  Knee flexion 4+ 4+  Hip abduction 4 4  Hip extension 4+ 3-*  Hip external rotation    Hip internal rotation    Hip adduction    Ankle dorsiflexion (L4)    Ankle plantarflexion (S1)    Ankle inversion    Ankle eversion    Great Toe ext (L5)    Grossly     (Blank rows = not tested, score listed is out of 5 possible points.  N = WNL, D = diminished, C =  clear for gross weakness with myotome testing, * = concordant pain with testing)   MUSCLE LENGTH: Hamstrings: Right no restriction; Left no restriction [name] test: Right no restriction; Left no restriction   LE ROM:  ROM Right (Eval) Left (Eval)  Hip flexion N* N*  Hip extension    Hip abduction    Hip adduction    Hip internal rotation    Hip external rotation limited limited  Knee flexion    Knee extension    Ankle dorsiflexion    Ankle plantarflexion    Ankle inversion    Ankle eversion      (Blank rows = not tested, N = WNL, * = concordant pain with testing)     PALPATION:   PA mobs lumbar spine cause R sided pain   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  Pt educated via explanation, demonstration, and handout (HEP).  Pt confirms understanding verbally.   HOME EXERCISE PROGRAM: Access Code: [email or web address] URL: [email or web address] Date: 12/27/2022 Prepared by: Mauri Reading  Exercises - Supine Hip Adduction Isometric with Ball  - 1 x daily - 7 x weekly - 2 sets - 10 reps - 10'' hold - Clamshell with Resistance  - 1 x daily - 7 x weekly - 3 sets - 10 reps - Supine Bridge with Pelvic Floor Contraction on Swiss Ball  - 1 x daily - 7 x weekly - 3 sets - 10 reps - Supine Knees to Chest with Swiss Ball  - 1 x daily - 7 x weekly - 3 sets - 10 reps - Supine Lower Trunk Rotation with Swiss Ball  - 1 x daily - 7 x weekly - 3 sets - 10 reps - Kettlebell Deadlift  - 1 x daily - 7 x weekly - 2 sets - 10 reps - Isometric Dead Bug with Swiss Ball  - 1 x daily - 7 x weekly - 2 sets - 10 reps  Treatment priorities   Eval        R Rotation, R S/B        L hip ext strength        Lumbar flexion        Bil hip ER        CHECK PIRIFORMIS          TODAY'S TREATMENT   [facility] Adult PT Treatment:                                                DATE: 9/52024  Therapeutic Exercise: nu-step L4 88m while taking subjective and planning session with patient STS - x 10 from low table, 2 x 10 from low table with airex under feet  LTR with pball x10 DKTC with pball x20 Bridge on pball x 15 Seated pball rolls DL progression, thigh slide against wall x 10, thigh slide with 5# DB BIL x 10, thigh slide + hip extension x 10 with 7# DB BIL Updated HEP    [facility] Adult PT Treatment:  DATE: 12/25/2022  Therapeutic Exercise: nu-step L4 38m while  taking subjective and planning session with patient STS - x 10 from low table, 2 x 10 from low table with airex under feet  LTR with pball x10 DKTC with pball x20 Bridge on pball with HS curl x 15 DL progression, thigh slide against wall x 10, thigh slide with 5# DB BIL x 10, thigh slide + hip extension x 10 with 7# DB BIL Dead bugs x several   [facility] Adult PT Treatment:                                                DATE: 12/20/2022  Therapeutic Exercise: nu-step L4 16m while taking subjective and planning session with patient S/L clam with green TB - 2x10 STS - with bar support - 3x10 Seated lumbar flexion/Thigh slide - 2x10 LTR with pball x10 DKTC with pball x20 Bridge on pball x 15 DL progression, thigh slide against wall x 10, thigh slide with 5# DB BIL x 10, thigh slide + hip extension x 10 with 5# DB BIL  [facility] Adult PT Treatment:                                                DATE: 12/18/22 Therapeutic Exercise: nu-step L5 67m while taking subjective and planning session with patient Lumbar ball roll outs - 10x L/R/C S/L clam with RTB - 2x10 STS - with bar support - 3x10 Thigh slide - 2x10 L lateral shift correction at wall - D/C d/t pain Row with lumbar ext - 13# - 2x10 LTR with pball x10 DKTC with pball x10 Bridge on pball - 2x10 Paloff press - GTB - 2x10    ASSESSMENT:  CLINICAL IMPRESSION:  Aylee is demonstrating significant improvement with supine to sit transfer and improved tolerance of therapeutic exercises. She was able to perform 20 glute bridges without physioball today and without exacerbation of symptoms. She was also able to tolerate several dead bug variations today with only expected muscle fatigue. We will continue to progress these exercises as patient is able to tolerate.    OBJECTIVE IMPAIRMENTS: Pain, hip mobility, lumbar mobility, hip and core strength  ACTIVITY LIMITATIONS: bending, lifting, transfers, bed mobility, housework  PERSONAL FACTORS:  See medical history and pertinent history   REHAB POTENTIAL: Good  CLINICAL DECISION MAKING: Stable/uncomplicated  EVALUATION COMPLEXITY: Low   GOALS:   SHORT TERM GOALS: Target date: 12/29/2022   Zetta will be >75% HEP compliant to improve carryover between sessions and facilitate independent management of condition  Evaluation: ongoing Goal status: INITIAL   LONG TERM GOALS: Target date: 01/26/2023   Chevi will improve FOTO score to goal as a proxy for functional improvement  Evaluation/Baseline: 64% Goal status: INITIAL    2.  Kaleb will self report >/= 50% decrease in pain from evaluation   Evaluation/Baseline: 5/10 max pain Goal status: INITIAL   3.  Jaxsyn will report confidence in self management of condition at time of discharge with advanced HEP  Evaluation/Baseline: unable to self manage Goal status: INITIAL   4.  Anberlin will self report significant improvement in mobility  Evaluation/Baseline: limited and painful Goal status: INITIAL    PLAN: PT FREQUENCY:  1-2x/week  PT DURATION: 8 weeks  PLANNED INTERVENTIONS: Therapeutic exercises, Aquatic therapy, Therapeutic activity, Neuro Muscular re-education, Gait training, Patient/Family education, Joint mobilization, Dry Needling, Electrical stimulation, Spinal mobilization and/or manipulation, Moist heat, Taping, Vasopneumatic device, Ionotophoresis 4mg /ml Dexamethasone, and Manual therapy   Mauri Reading PT, DPT 12/27/2022, 5:42 PM

## 2023-01-01 ENCOUNTER — Ambulatory Visit: Payer: 59

## 2023-01-01 DIAGNOSIS — M5459 Other low back pain: Secondary | ICD-10-CM | POA: Diagnosis not present

## 2023-01-01 DIAGNOSIS — M6281 Muscle weakness (generalized): Secondary | ICD-10-CM

## 2023-01-01 NOTE — Therapy (Signed)
Daily Note  Patient Name: Cynthia Barrett MRN: [id number] DOB:[birth year], 57 y.o., female Today's Date: 01/01/2023    PT End of Session - 01/01/23 1614     Visit Number 8    Date for PT Re-Evaluation 01/26/23    Authorization Type [employer]    PT Start Time 1615    PT Stop Time 1655    PT Time Calculation (min) 40 min    Activity Tolerance Patient tolerated treatment well    Behavior During Therapy [facility] for tasks assessed/performed                Past Medical History:  Diagnosis Date   Anxiety    Depression    Past Surgical History:  Procedure Laterality Date   TOTAL LAPAROSCOPIC HYSTERECTOMY WITH SALPINGECTOMY  2022   wFU   TUBAL LIGATION  2004   tummy tuck  10/2004   Patient Active Problem List   Diagnosis Date Noted   ADHD, 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 No, Number of falls: 0  MOI/History of condition:  Onset date: >2 years  SUBJECTIVE STATEMENT  Patient states that she doesn't have any pain today, because she started taking meloxicam daily.    Red flags:  denies   Pain:  Are you having pain? Yes Pain location: R>L low back pain NPRS scale:  0/10 to 5/10 Aggravating factors: rotation or sitting for long periods Relieving factors: laying flat or standing Pain description: sharp and aching Stage: Chronic 24 hour pattern: worse with activity   Occupation: works at [employer] (walking  constantly)  Assistive Device: NA  Hand Dominance: NA  Patient Goals/Specific Activities: Reduce pain, get exercise problem to manage.   OBJECTIVE:   DIAGNOSTIC FINDINGS:   X-Ray Multilevel moderate degenerative disc disease and facet arthropathy, Findings have progressed when compared with the prior exam.  GENERAL OBSERVATION/GAIT:  Significant difficult with lumbar rotation limiting bed mobility  SENSATION:  Light touch: Appears intact  LUMBAR AROM  AROM AROM  (Eval)  Flexion Fingertips to mid shin  Extension limited by 25%  Right lateral flexion limited by 75%, w/ concordant pain (SI?  Left lateral flexion limited by 25%  Right rotation limited by 50%, w/ concordant pain  Left rotation limited by 25%    (Blank rows = not tested)   LE MMT:  MMT Right (Eval) Left (Eval)  Hip flexion (L2, L3) 4 4  Knee extension (L3) 4+ 4+  Knee flexion 4+ 4+  Hip abduction 4 4  Hip extension 4+ 3-*  Hip external rotation    Hip internal rotation    Hip adduction    Ankle dorsiflexion (L4)    Ankle plantarflexion (S1)    Ankle inversion    Ankle eversion    Great Toe ext (L5)    Grossly     (Blank rows = not tested, score listed is out of 5 possible points.  N = WNL, D = diminished, C =  clear for gross weakness with myotome testing, * = concordant pain with testing)   MUSCLE LENGTH: Hamstrings: Right no restriction; Left no restriction [name] test: Right no restriction; Left no restriction   LE ROM:  ROM Right (Eval) Left (Eval)  Hip flexion N* N*  Hip extension    Hip abduction    Hip adduction    Hip internal rotation    Hip external rotation limited limited  Knee flexion    Knee extension    Ankle dorsiflexion    Ankle plantarflexion    Ankle inversion    Ankle eversion      (Blank rows = not tested, N = WNL, * = concordant pain with testing)     PALPATION:   PA mobs lumbar spine cause R sided pain   Cluster of Laslett (2/4 tests considered  +)  Distraction: NT Thigh thrust: negative Compression (S/L): negative Sacral thrust: negative   PATIENT SURVEYS:  FOTO TAKE VISIT 2 64%   TODAY'S TREATMENT   Creating, reviewing, and completing below HEP   PATIENT EDUCATION:  POC, diagnosis, prognosis, HEP, and outcome measures.  Pt educated via explanation, demonstration, and handout (HEP).  Pt confirms understanding verbally.    HOME EXERCISE PROGRAM: Access Code: [email or web address] URL: [email or web address] Date: 12/27/2022 Prepared by: Mauri Reading  Exercises - Supine Hip Adduction Isometric with Ball  - 1 x daily - 7 x weekly - 2 sets - 10 reps - 10'' hold - Clamshell with Resistance  - 1 x daily - 7 x weekly - 3 sets - 10 reps - Supine Bridge with Pelvic Floor Contraction on Swiss Ball  - 1 x daily - 7 x weekly - 3 sets - 10 reps - Supine Knees to Chest with Swiss Ball  - 1 x daily - 7 x weekly - 3 sets - 10 reps - Supine Lower Trunk Rotation with Swiss Ball  - 1 x daily - 7 x weekly - 3 sets - 10 reps - Kettlebell Deadlift  - 1 x daily - 7 x weekly - 2 sets - 10 reps - Isometric Dead Bug with Swiss Ball  - 1 x daily - 7 x weekly - 2 sets - 10 reps  Treatment priorities   Eval    R Rotation, R S/B    L hip ext strength    Lumbar flexion    Bil hip ER    CHECK PIRIFORMIS      TODAY'S TREATMENT   [facility] Adult PT Treatment:                                                DATE: 01/01/2023  Therapeutic Exercise:   nu-step L4 46m while taking subjective and planning session with patient STS - x 15  from low table with airex under feet  DL progression, 2 Z61 with 9# DB BIL Glute Bridges, 2 x 10  Bridge on pball with HS curl, 2 x 10  Dead bugs with pball, 3 x 5 , followed by 1 x 4 without pball  LTR with pball x 2 min DKTC with pball x 1 min  Seated pball rolls  [facility] Adult PT Treatment:  DATE: 12/27/2022  Therapeutic Exercise: nu-step L4 38m while taking  subjective and planning session with patient STS - x 10 from low table, 2 x 10 from low table with airex under feet  LTR with pball x10 DKTC with pball x20 Bridge on pball x 15 Seated pball rolls DL progression, thigh slide against wall x 10, thigh slide with 5# DB BIL x 10, thigh slide + hip extension x 10 with 7# DB BIL Updated HEP    [facility] Adult PT Treatment:                                                DATE: 12/25/2022  Therapeutic Exercise: nu-step L4 73m while taking subjective and planning session with patient STS - x 10 from low table, 2 x 10 from low table with airex under feet  LTR with pball x10 DKTC with pball x20 Bridge on pball with HS curl x 15 DL progression, thigh slide against wall x 10, thigh slide with 5# DB BIL x 10, thigh slide + hip extension x 10 with 7# DB BIL Dead bugs x several      ASSESSMENT:  CLINICAL IMPRESSION:  Cynthia Barrett continues to tolerate gradual progression of exercises today, including progression of STS  and DL. She was able to perform dead bug with acceptable muscle fatigue level at end of session. She did experience a "catching" when sitting up after supine exercises, which resolved after pball rolling for LS AROM. She will continue to benefit from progression of core strengthening activities, with use of pain modulation activities as indicated.     OBJECTIVE IMPAIRMENTS: Pain, hip mobility, lumbar mobility, hip and core strength  ACTIVITY LIMITATIONS: bending, lifting, transfers, bed mobility, housework  PERSONAL FACTORS: See medical history and pertinent history   REHAB POTENTIAL: Good  CLINICAL DECISION MAKING: Stable/uncomplicated  EVALUATION COMPLEXITY: Low   GOALS:   SHORT TERM GOALS: Target date: 12/29/2022   Cynthia Barrett will be >75% HEP compliant to improve carryover between sessions and facilitate independent management of condition  Evaluation: ongoing Goal status: MET   LONG TERM GOALS: Target date: 01/26/2023   Cynthia Barrett  will improve FOTO score to goal as a proxy for functional improvement  Evaluation/Baseline: 64% Goal status: INITIAL    2.  Cynthia Barrett will self report >/= 50% decrease in pain from evaluation   Evaluation/Baseline: 5/10 max pain Goal status: INITIAL   3.  Cynthia Barrett will report confidence in self management of condition at time of discharge with advanced HEP  Evaluation/Baseline: unable to self manage Goal status: INITIAL   4.  Cynthia Barrett will self report significant improvement in mobility  Evaluation/Baseline: limited and painful Goal status: INITIAL    PLAN: PT FREQUENCY: 1-2x/week  PT DURATION: 8 weeks  PLANNED INTERVENTIONS: Therapeutic exercises, Aquatic therapy, Therapeutic activity, Neuro Muscular re-education, Gait training, Patient/Family education, Joint mobilization, Dry Needling, Electrical stimulation, Spinal mobilization and/or manipulation, Moist heat, Taping, Vasopneumatic device, Ionotophoresis 4mg /ml Dexamethasone, and Manual therapy   Mauri Reading PT, DPT 01/01/2023, 4:58 PM

## 2023-01-03 ENCOUNTER — Encounter: Payer: Self-pay | Admitting: Family Medicine

## 2023-01-03 ENCOUNTER — Ambulatory Visit: Payer: 59

## 2023-01-03 DIAGNOSIS — M5459 Other low back pain: Secondary | ICD-10-CM | POA: Diagnosis not present

## 2023-01-03 DIAGNOSIS — M6281 Muscle weakness (generalized): Secondary | ICD-10-CM

## 2023-01-03 NOTE — Therapy (Signed)
Daily Note  Patient Name: Cynthia Barrett MRN: [id number] DOB:1965/05/07, 57 y.o., female Today's Date: 01/03/2023    PT End of Session - 01/03/23 1611     Visit Number 9    Date for PT Re-Evaluation 01/26/23    Authorization Type [employer]    PT Start Time 1612    PT Stop Time 1652    PT Time Calculation (min) 40 min    Activity Tolerance Patient tolerated treatment well    Behavior During Therapy Cynthia Barrett for tasks assessed/performed              Past Medical History:  Diagnosis Date   Anxiety    Depression    Past Surgical History:  Procedure Laterality Date   TOTAL LAPAROSCOPIC HYSTERECTOMY WITH SALPINGECTOMY  2022   wFU   TUBAL LIGATION  2004   tummy tuck  10/2004   Patient Active Problem List   Diagnosis Date Noted   ADHD, 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 No, Number of falls: 0  MOI/History of condition:  Onset date: >2 years  SUBJECTIVE STATEMENT   Patient states that she has a little bit of pain today.     Red flags:  denies   Pain:  Are you having pain? Yes Pain location: R>L low back pain NPRS scale:  0/10 to 5/10 Aggravating factors: rotation or sitting for long periods Relieving factors: laying flat or standing Pain description: sharp and aching Stage: Chronic 24 hour pattern: worse with activity   Occupation: works at [employer] (walking constantly)  Assistive Device: NA  Hand  Dominance: NA  Patient Goals/Specific Activities: Reduce pain, get exercise problem to manage.   OBJECTIVE:   DIAGNOSTIC FINDINGS:   X-Ray Multilevel moderate degenerative disc disease and facet arthropathy, Findings have progressed when compared with the prior exam.  GENERAL OBSERVATION/GAIT:  Significant difficult with lumbar rotation limiting bed mobility  SENSATION:  Light touch: Appears intact  LUMBAR AROM  AROM AROM  (Eval)  Flexion Fingertips to mid shin  Extension limited by 25%  Right lateral flexion limited by 75%, w/ concordant pain (SI?  Left lateral flexion limited by 25%  Right rotation limited by 50%, w/ concordant pain  Left rotation limited by 25%    (Blank rows = not tested)   LE MMT:  MMT Right (Eval) Left (Eval)  Hip flexion (L2, L3) 4 4  Knee extension (L3) 4+ 4+  Knee flexion 4+ 4+  Hip abduction 4 4  Hip extension 4+ 3-*  Hip external rotation    Hip internal rotation    Hip adduction    Ankle dorsiflexion (L4)    Ankle plantarflexion (S1)    Ankle inversion    Ankle eversion    Great Toe ext (L5)    Grossly     (Blank rows = not tested, score listed is out of 5 possible points.  N = WNL, D = diminished, C = clear for gross weakness  with myotome testing, * = concordant pain with testing)   MUSCLE LENGTH: Hamstrings: Right no restriction; Left no restriction [name] test: Right no restriction; Left no restriction   LE ROM:  ROM Right (Eval) Left (Eval)  Hip flexion N* N*  Hip extension    Hip abduction    Hip adduction    Hip internal rotation    Hip external rotation limited limited  Knee flexion    Knee extension    Ankle dorsiflexion    Ankle plantarflexion    Ankle inversion    Ankle eversion      (Blank rows = not tested, N = WNL, * = concordant pain with testing)     PALPATION:   PA mobs lumbar spine cause R sided pain   Cluster of Laslett (2/4 tests considered +)  Distraction: NT Thigh thrust:  negative Compression (S/L): negative Sacral thrust: negative   PATIENT SURVEYS:  FOTO TAKE VISIT 2 64%   TODAY'S TREATMENT   Creating, reviewing, and completing below HEP   PATIENT EDUCATION:  POC, diagnosis, prognosis, HEP, and outcome measures.  Pt educated via explanation, demonstration, and handout (HEP).  Pt confirms understanding verbally.    HOME EXERCISE PROGRAM: Access Code: [email or web address] URL: [email or web address] Date: 12/27/2022 Prepared by: Mauri Reading  Exercises - Supine Hip Adduction Isometric with Ball  - 1 x daily - 7 x weekly - 2 sets - 10 reps - 10'' hold - Clamshell with Resistance  - 1 x daily - 7 x weekly - 3 sets - 10 reps - Supine Bridge with Pelvic Floor Contraction on Swiss Ball  - 1 x daily - 7 x weekly - 3 sets - 10 reps - Supine Knees to Chest with Swiss Ball  - 1 x daily - 7 x weekly - 3 sets - 10 reps - Supine Lower Trunk Rotation with Swiss Ball  - 1 x daily - 7 x weekly - 3 sets - 10 reps - Kettlebell Deadlift  - 1 x daily - 7 x weekly - 2 sets - 10 reps - Isometric Dead Bug with Swiss Ball  - 1 x daily - 7 x weekly - 2 sets - 10 reps  Treatment priorities   Eval    R Rotation, R S/B    L hip ext strength    Lumbar flexion    Bil hip ER    CHECK PIRIFORMIS      TODAY'S TREATMENT   [facility] Adult PT Treatment:                                                DATE: 01/03/2023  Therapeutic Exercise:   nu-step L4 29m while taking subjective and planning session with patient Bridge on pball with HS curl, 2 x 15 DL 2 E95 with 28# DB BIL Glute Bridges, with hip abduction, blue TB, 2 x 10  Dead bugs with pball, 1 x 5 pball, 2 x 5 without, 1 x 15 without  LTR with pball x 2 min DKTC with pball x 1 min  Pallof press, green TB, 2 x 10 BIL    [facility] Adult PT Treatment:  DATE: 01/01/2023  Therapeutic Exercise:   nu-step L4 34m while taking subjective and planning session with patient STS - x  15  from low table with airex under feet  DL progression, 2 Z61 with 9# DB BIL Glute Bridges, 2 x 10  Bridge on pball with HS curl, 2 x 10  Dead bugs with pball, 3 x 5 , followed by 1 x 4 without pball  LTR with pball x 2 min DKTC with pball x 1 min  Seated pball rolls  [facility] Adult PT Treatment:                                                DATE: 12/27/2022  Therapeutic Exercise: nu-step L4 90m while taking subjective and planning session with patient STS - x 10 from low table, 2 x 10 from low table with airex under feet  LTR with pball x10 DKTC with pball x20 Bridge on pball x 15 Seated pball rolls DL progression, thigh slide against wall x 10, thigh slide with 5# DB BIL x 10, thigh slide + hip extension x 10 with 7# DB BIL Updated HEP      ASSESSMENT:  CLINICAL IMPRESSION:  Maritsa was able to tolerate increased repetitions of strengthening activities today to further address muscular endurance. She was able to begin standing isometric core activities with reasonable challenge. We will continue to progress towards functional strengthening in order to reach established goals.     OBJECTIVE IMPAIRMENTS: Pain, hip mobility, lumbar mobility, hip and core strength  ACTIVITY LIMITATIONS: bending, lifting, transfers, bed mobility, housework  PERSONAL FACTORS: See medical history and pertinent history   REHAB POTENTIAL: Good  CLINICAL DECISION MAKING: Stable/uncomplicated  EVALUATION COMPLEXITY: Low   GOALS:   SHORT TERM GOALS: Target date: 12/29/2022   Raashida will be >75% HEP compliant to improve carryover between sessions and facilitate independent management of condition  Evaluation: ongoing Goal status: MET   LONG TERM GOALS: Target date: 01/26/2023   Nasiah will improve FOTO score to goal as a proxy for functional improvement  Evaluation/Baseline: 64% Goal status: INITIAL    2.  Ernie will self report >/= 50% decrease in pain from evaluation    Evaluation/Baseline: 5/10 max pain Goal status: INITIAL   3.  Bravery will report confidence in self management of condition at time of discharge with advanced HEP  Evaluation/Baseline: unable to self manage Goal status: INITIAL   4.  Persayus will self report significant improvement in mobility  Evaluation/Baseline: limited and painful Goal status: INITIAL    PLAN: PT FREQUENCY: 1-2x/week  PT DURATION: 8 weeks  PLANNED INTERVENTIONS: Therapeutic exercises, Aquatic therapy, Therapeutic activity, Neuro Muscular re-education, Gait training, Patient/Family education, Joint mobilization, Dry Needling, Electrical stimulation, Spinal mobilization and/or manipulation, Moist heat, Taping, Vasopneumatic device, Ionotophoresis 4mg /ml Dexamethasone, and Manual therapy   Mauri Reading PT, DPT 01/03/2023, 6:33 PM

## 2023-01-04 MED ORDER — MELOXICAM 15 MG PO TABS: 15.0000 mg | ORAL_TABLET | Freq: Every day | ORAL | 0 refills | Status: DC | PRN

## 2023-01-04 NOTE — Telephone Encounter (Signed)
Requesting a rx for Meloxicam Last written by historical provider Last OV 10/15/2022 No upcoming appt schld

## 2023-01-10 ENCOUNTER — Other Ambulatory Visit: Payer: Self-pay

## 2023-01-10 ENCOUNTER — Ambulatory Visit: Payer: 59

## 2023-01-10 DIAGNOSIS — M6281 Muscle weakness (generalized): Secondary | ICD-10-CM

## 2023-01-10 DIAGNOSIS — M5459 Other low back pain: Secondary | ICD-10-CM | POA: Diagnosis not present

## 2023-01-10 NOTE — Therapy (Signed)
Daily Note  Patient Name: Cynthia Barrett MRN: [id number] DOB:20-Nov-1965, 57 y.o., female Today's Date: 01/10/2023   PT End of Session - 01/10/23 1739     Visit Number 10    Date for PT Re-Evaluation 01/26/23    Authorization Type UHC    PT Start Time 1610    PT Stop Time 1650    PT Time Calculation (min) 40 min    Activity Tolerance Patient tolerated treatment well    Behavior During Therapy [facility] for tasks assessed/performed               Past Medical History:  Diagnosis Date   Anxiety    Depression    Past Surgical History:  Procedure Laterality Date   TOTAL LAPAROSCOPIC HYSTERECTOMY WITH SALPINGECTOMY  2022   wFU   TUBAL LIGATION  2004   tummy tuck  10/2004   Patient Active Problem List   Diagnosis Date Noted   ADHD, 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 No, Number of falls: 0  MOI/History of condition:  Onset date: >2 years  SUBJECTIVE STATEMENT   Patient reports she had her physician prescribe her meloxicam to help her with pain while she goes through the physical therapy process. She stated the exercises tend to increase her symptoms, so the meloxicam keeps her more comfortable after sessions. The patient noted that her pain is much lower when she takes the meloxicam.    Red flags:  denies   Pain:  Are you having pain? Yes Pain location: R>L low back pain NPRS scale:  0/10 to 5/10 Aggravating  factors: rotation or sitting for long periods Relieving factors: laying flat or standing Pain description: sharp and aching Stage: Chronic 24 hour pattern: worse with activity   Occupation: works at [employer] (walking constantly)  Assistive Device: NA  Hand Dominance: NA  Patient Goals/Specific Activities: Reduce pain, get exercise problem to manage.   OBJECTIVE:   DIAGNOSTIC FINDINGS:   X-Ray Multilevel moderate degenerative disc disease and facet arthropathy, Findings have progressed when compared with the prior exam.  GENERAL OBSERVATION/GAIT:  Significant difficult with lumbar rotation limiting bed mobility  SENSATION:  Light touch: Appears intact  LUMBAR AROM  AROM AROM  (Eval)  Flexion Fingertips to mid shin  Extension limited by 25%  Right lateral flexion limited by 75%, w/ concordant pain (SI?  Left lateral flexion limited by 25%  Right rotation limited by 50%, w/ concordant pain  Left rotation limited by 25%    (Blank rows = not tested)   LE MMT:  MMT Right (Eval) Left (Eval)  Hip flexion (L2, L3) 4 4  Knee extension (L3) 4+ 4+  Knee flexion 4+ 4+  Hip abduction 4 4  Hip extension 4+ 3-*  Hip external rotation    Hip internal rotation    Hip adduction    Ankle dorsiflexion (L4)    Ankle plantarflexion (S1)    Ankle inversion    Ankle  eversion    Great Toe ext (L5)    Grossly     (Blank rows = not tested, score listed is out of 5 possible points.  N = WNL, D = diminished, C = clear for gross weakness with myotome testing, * = concordant pain with testing)   MUSCLE LENGTH: Hamstrings: Right no restriction; Left no restriction [name] test: Right no restriction; Left no restriction   LE ROM:  ROM Right (Eval) Left (Eval)  Hip flexion N* N*  Hip extension    Hip abduction    Hip adduction    Hip internal rotation    Hip external rotation limited limited  Knee flexion    Knee extension    Ankle dorsiflexion    Ankle  plantarflexion    Ankle inversion    Ankle eversion      (Blank rows = not tested, N = WNL, * = concordant pain with testing)     PALPATION:   PA mobs lumbar spine cause R sided pain   Cluster of Laslett (2/4 tests considered +)  Distraction: NT Thigh thrust: negative Compression (S/L): negative Sacral thrust: negative   PATIENT SURVEYS:  FOTO TAKE VISIT 2 64%   TODAY'S TREATMENT   Creating, reviewing, and completing below HEP   PATIENT EDUCATION:  POC, diagnosis, prognosis, HEP, and outcome measures.  Pt educated via explanation, demonstration, and handout (HEP).  Pt confirms understanding verbally.    HOME EXERCISE PROGRAM: Access Code: [email or web address] URL: [email or web address] Date: 01/10/2023 Prepared by: Edmonia Caprio  Program Notes Added (1) hooklying pelvis expansion inhales, (2) hooklying rib cage expansion inhales + active expansion, (3) hooklying self mobilizations for the R lumbar spine using tennis ball  Exercises - Supine Hip Adduction Isometric with Ball  - 1 x daily - 7 x weekly - 2 sets - 10 reps - 10'' hold - Clamshell with Resistance  - 1 x daily - 7 x weekly - 3 sets - 10 reps - Supine Bridge with Pelvic Floor Contraction on Swiss Ball  - 1 x daily - 7 x weekly - 3 sets - 10 reps - Supine Knees to Chest with Swiss Ball  - 1 x daily - 7 x weekly - 3 sets - 10 reps - Supine Lower Trunk Rotation with Swiss Ball  - 1 x daily - 7 x weekly - 3 sets - 10 reps - Kettlebell Deadlift  - 1 x daily - 7 x weekly - 2 sets - 10 reps - Isometric Dead Bug with Swiss Ball  - 1 x daily - 7 x weekly - 2 sets - 10 reps  Treatment priorities   Eval    R Rotation, R S/B    L hip ext strength    Lumbar flexion    Bil hip ER    CHECK PIRIFORMIS      TODAY'S TREATMENT  [facility] Adult PT Treatment:                                                DATE: 01/10/2023 Therapeutic Exercise: L sidelying: Manual stretching of the R lumbar paraspinals Hooklying -  instructed: Pelvic expansion inhales, 5 min Rib cage expansion inhales + active rib cage expansion, 5 min Self mobilization for R lumbar region using tennis ball Manual Therapy: L sidelying: Soft itssue mobiliation R lumbar paraspinals Unilateral PA glides R L4/L5  [facility] Adult PT Treatment:                                                DATE: 01/03/2023  Therapeutic Exercise:   nu-step L4 8m while taking subjective and planning session with patient Bridge on pball with HS curl, 2 x 15 DL 2 B14 with 78# DB BIL Glute Bridges, with hip abduction, blue TB, 2 x 10  Dead bugs with pball, 1 x 5 pball, 2 x 5 without, 1 x 15 without  LTR with pball x 2 min DKTC with pball x 1 min  Pallof press, green TB, 2 x 10 BIL    [facility] Adult PT Treatment:                                                DATE: 01/01/2023  Therapeutic Exercise:   nu-step L4 61m while taking subjective and planning session with patient STS - x 15  from low table with airex under feet  DL progression, 2 G95 with 9# DB BIL Glute Bridges, 2 x 10  Bridge on pball with HS curl, 2 x 10  Dead bugs with pball, 3 x 5 , followed by 1 x 4 without pball  LTR with pball x 2 min DKTC with pball x 1 min  Seated pball rolls  [facility] Adult PT Treatment:                                                DATE: 12/27/2022  Therapeutic Exercise: nu-step L4 63m while taking subjective and planning session with patient STS - x 10 from low table, 2 x 10 from low table with airex under feet  LTR with pball x10 DKTC with pball x20 Bridge on pball x 15 Seated pball rolls DL progression, thigh slide against wall x 10, thigh slide with 5# DB BIL x 10, thigh slide + hip extension x 10 with 7# DB BIL Updated HEP     ASSESSMENT:  CLINICAL IMPRESSION:  Addressed areas of hyperalgesia/stiffness at the soft tissues/facet joints of the lower R lumbar spine - some increased symptoms with treatment which calmed by end of session but advised patient that  soreness after manual intervention is common. Added self mobilization to the R lumbar spine to home program along with exercises to improve rib cage and pelvic mobility to reduce load on lumbar spine. Physical therapy remains indicated.   OBJECTIVE IMPAIRMENTS: Pain, hip mobility, lumbar mobility, hip and core strength  ACTIVITY LIMITATIONS: bending, lifting, transfers, bed mobility, housework  PERSONAL FACTORS: See medical history and pertinent history   REHAB POTENTIAL: Good  CLINICAL DECISION MAKING: Stable/uncomplicated  EVALUATION COMPLEXITY: Low   GOALS:   SHORT TERM GOALS: Target date: 12/29/2022   Haylo will be >75% HEP compliant to improve carryover between sessions and facilitate independent management of condition  Evaluation: ongoing Goal status: MET   LONG TERM GOALS: Target date: 01/26/2023   Terianna will improve FOTO score to goal as a proxy for functional improvement  Evaluation/Baseline: 64% Goal status: INITIAL    2.  Bufford Lope  will self report >/= 50% decrease in pain from evaluation   Evaluation/Baseline: 5/10 max pain Goal status: INITIAL   3.  Gussie will report confidence in self management of condition at time of discharge with advanced HEP  Evaluation/Baseline: unable to self manage Goal status: INITIAL   4.  San will self report significant improvement in mobility  Evaluation/Baseline: limited and painful Goal status: INITIAL    PLAN: PT FREQUENCY: 1-2x/week  PT DURATION: 8 weeks  PLANNED INTERVENTIONS: Therapeutic exercises, Aquatic therapy, Therapeutic activity, Neuro Muscular re-education, Gait training, Patient/Family education, Joint mobilization, Dry Needling, Electrical stimulation, Spinal mobilization and/or manipulation, Moist heat, Taping, Vasopneumatic device, Ionotophoresis 4mg /ml Dexamethasone, and Manual therapy   Clelia Schaumann PT, DPT 01/10/2023, 5:49 PM

## 2023-01-12 ENCOUNTER — Ambulatory Visit: Payer: 59

## 2023-01-12 DIAGNOSIS — M6281 Muscle weakness (generalized): Secondary | ICD-10-CM

## 2023-01-12 DIAGNOSIS — M5459 Other low back pain: Secondary | ICD-10-CM

## 2023-01-12 NOTE — Therapy (Signed)
Daily Note  Patient Name: Cynthia Barrett MRN: [id number] DOB:[birth year], 57 y.o., female Today's Date: 01/12/2023   PT End of Session - 01/12/23 1027     Visit Number 11    Date for PT Re-Evaluation 01/26/23    Authorization Type U[employer]    PT Start Time 0947    PT Stop Time 1030    PT Time Calculation (min) 43 min    Activity Tolerance Patient tolerated treatment well    Behavior During Therapy [facility] for tasks assessed/performed                Past Medical History:  Diagnosis Date   Anxiety    Depression    Past Surgical History:  Procedure Laterality Date   TOTAL LAPAROSCOPIC HYSTERECTOMY WITH SALPINGECTOMY  2022   wFU   TUBAL LIGATION  2004   tummy tuck  10/2004   Patient Active Problem List   Diagnosis Date Noted   ADHD, 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 No, Number of falls: 0  MOI/History of condition:  Onset date: >2 years  SUBJECTIVE STATEMENT  Patient reports that she felt some relief after her last session with increased manual therapy. She felt sore going to the car, but felt better once she arrived home. She did her updated exercises yesterday and this morning.     Red flags:  denies   Pain:  Are you having pain? Yes Pain location: R>L low back pain NPRS scale:  0/10 to 5/10 Aggravating factors: rotation or sitting for long periods Relieving factors: laying flat or standing Pain  description: sharp and aching Stage: Chronic 24 hour pattern: worse with activity   Occupation: works at [employer] (walking constantly)  Assistive Device: NA  Hand Dominance: NA  Patient Goals/Specific Activities: Reduce pain, get exercise problem to manage.   OBJECTIVE:   DIAGNOSTIC FINDINGS:   X-Ray Multilevel moderate degenerative disc disease and facet arthropathy, Findings have progressed when compared with the prior exam.  GENERAL OBSERVATION/GAIT:  Significant difficult with lumbar rotation limiting bed mobility  SENSATION:  Light touch: Appears intact  LUMBAR AROM  AROM AROM  (Eval)  Flexion Fingertips to mid shin  Extension limited by 25%  Right lateral flexion limited by 75%, w/ concordant pain (SI?  Left lateral flexion limited by 25%  Right rotation limited by 50%, w/ concordant pain  Left rotation limited by 25%    (Blank rows = not tested)   LE MMT:  MMT Right (Eval) Left (Eval)  Hip flexion (L2, L3) 4 4  Knee extension (L3) 4+ 4+  Knee flexion 4+ 4+  Hip abduction 4 4  Hip extension 4+ 3-*  Hip external rotation    Hip internal rotation    Hip adduction    Ankle dorsiflexion (L4)    Ankle plantarflexion (S1)    Ankle inversion    Ankle eversion    Great Toe ext (L5)    Grossly     (  Blank rows = not tested, score listed is out of 5 possible points.  N = WNL, D = diminished, C = clear for gross weakness with myotome testing, * = concordant pain with testing)   MUSCLE LENGTH: Hamstrings: Right no restriction; Left no restriction [name] test: Right no restriction; Left no restriction   LE ROM:  ROM Right (Eval) Left (Eval)  Hip flexion N* N*  Hip extension    Hip abduction    Hip adduction    Hip internal rotation    Hip external rotation limited limited  Knee flexion    Knee extension    Ankle dorsiflexion    Ankle plantarflexion    Ankle inversion    Ankle eversion      (Blank rows = not tested, N = WNL, * =  concordant pain with testing)     PALPATION:   PA mobs lumbar spine cause R sided pain   Cluster of Laslett (2/4 tests considered +)  Distraction: NT Thigh thrust: negative Compression (S/L): negative Sacral thrust: negative   PATIENT SURVEYS:  FOTO TAKE VISIT 2 64%   TODAY'S TREATMENT   Creating, reviewing, and completing below HEP   PATIENT EDUCATION:  POC, diagnosis, prognosis, HEP, and outcome measures.  Pt educated via explanation, demonstration, and handout (HEP).  Pt confirms understanding verbally.    HOME EXERCISE PROGRAM: Access Code: [email or web address] URL: [email or web address] Date: 01/12/2023 Prepared by: Mauri Reading  Program Notes Added (1) hooklying pelvis expansion inhales, (2) hooklying rib cage expansion inhales + active expansion, (3) hooklying self mobilizations for the R lumbar spine using tennis ball  Exercises - Supine Lower Trunk Rotation with Swiss Ball  - 1 x daily - 7 x weekly - 3 sets - 10 reps - Bridge with Hamstring Curl on Swiss Ball  - 1 x daily - 7 x weekly - 2 sets - 10 reps - Isometric Dead Bug with Swiss Ball  - 1 x daily - 7 x weekly - 2 sets - 10 reps - Kettlebell Deadlift  - 1 x daily - 7 x weekly - 2 sets - 10 reps - Pelvic Tilt on Swiss Ball  - 1 x daily - 7 x weekly - 1 sets - 1 min  hold - Pelvic Circles on Swiss Ball  - 1 x daily - 7 x weekly - 1 sets - 1 min hold - Seated Lateral Pelvic Tilt on Swiss Ball  - 1 x daily - 7 x weekly - 1-2 sets - 30 min hold  Treatment priorities   Eval    R Rotation, R S/B    L hip ext strength    Lumbar flexion    Bil hip ER    CHECK PIRIFORMIS      TODAY'S TREATMENT    [facility] Adult PT Treatment:                                                DATE: 01/12/2023  Therapeutic Exercise:  nu-step L4 35m while taking subjective and planning session with patient Bridge on pball with HS curl, 2 x 10 Dead bugs, 2 x 10 each  S/L fire hydrants  Seated pelvic tilts fwd/back, lateral,  CW/CCW on dynadisc with mirror for feedback  Reviewed and updated HEP    [facility] Adult PT Treatment:  DATE: 01/10/2023  Therapeutic Exercise: L sidelying: Manual stretching of the R lumbar paraspinals Hooklying - instructed: Pelvic expansion inhales, 5 min Rib cage expansion inhales + active rib cage expansion, 5 min Self mobilization for R lumbar region using tennis ball  Manual Therapy: L sidelying: Soft itssue mobiliation R lumbar paraspinals Unilateral PA glides R L4/L5   [facility] Adult PT Treatment:                                                DATE: 01/03/2023  Therapeutic Exercise:   nu-step L4 60m while taking subjective and planning session with patient Bridge on pball with HS curl, 2 x 15 DL 2 Q03 with 47# DB BIL Glute Bridges, with hip abduction, blue TB, 2 x 10  Dead bugs with pball, 1 x 5 pball, 2 x 5 without, 1 x 15 without  LTR with pball x 2 min DKTC with pball x 1 min  Pallof press, green TB, 2 x 10 BIL    [facility] Adult PT Treatment:                                                DATE: 01/01/2023  Therapeutic Exercise:   nu-step L4 23m while taking subjective and planning session with patient STS - x 15  from low table with airex under feet  DL progression, 2 Q25 with 9# DB BIL Glute Bridges, 2 x 10  Bridge on pball with HS curl, 2 x 10  Dead bugs with pball, 3 x 5 , followed by 1 x 4 without pball  LTR with pball x 2 min DKTC with pball x 1 min  Seated pball rolls  [facility] Adult PT Treatment:                                                DATE: 12/27/2022  Therapeutic Exercise: nu-step L4 13m while taking subjective and planning session with patient STS - x 10 from low table, 2 x 10 from low table with airex under feet  LTR with pball x10 DKTC with pball x20 Bridge on pball x 15 Seated pball rolls DL progression, thigh slide against wall x 10, thigh slide with 5# DB BIL x 10, thigh slide + hip extension x 10 with 7#  DB BIL Updated HEP     ASSESSMENT:  CLINICAL IMPRESSION:  Patient is demonstrating improved overall performance of pelvic mobility and LQ control with exercises. We will continue to progress exercises as tolerated in order to return to PLOF.     OBJECTIVE IMPAIRMENTS: Pain, hip mobility, lumbar mobility, hip and core strength  ACTIVITY LIMITATIONS: bending, lifting, transfers, bed mobility, housework  PERSONAL FACTORS: See medical history and pertinent history   REHAB POTENTIAL: Good  CLINICAL DECISION MAKING: Stable/uncomplicated  EVALUATION COMPLEXITY: Low   GOALS:   SHORT TERM GOALS: Target date: 12/29/2022   Kristabella will be >75% HEP compliant to improve carryover between sessions and facilitate independent management of condition  Evaluation: ongoing Goal status: MET   LONG TERM GOALS: Target date: 01/26/2023   Andia will improve  FOTO score to goal as a proxy for functional improvement  Evaluation/Baseline: 64% Goal status: INITIAL    2.  Tuongvi will self report >/= 50% decrease in pain from evaluation   Evaluation/Baseline: 5/10 max pain Goal status: INITIAL   3.  Jessicka will report confidence in self management of condition at time of discharge with advanced HEP  Evaluation/Baseline: unable to self manage Goal status: INITIAL   4.  Jailine will self report significant improvement in mobility  Evaluation/Baseline: limited and painful Goal status: INITIAL    PLAN: PT FREQUENCY: 1-2x/week  PT DURATION: 8 weeks  PLANNED INTERVENTIONS: Therapeutic exercises, Aquatic therapy, Therapeutic activity, Neuro Muscular re-education, Gait training, Patient/Family education, Joint mobilization, Dry Needling, Electrical stimulation, Spinal mobilization and/or manipulation, Moist heat, Taping, Vasopneumatic device, Ionotophoresis 4mg /ml Dexamethasone, and Manual therapy   Mauri Reading PT, DPT 01/12/2023, 11:44 AM

## 2023-01-15 ENCOUNTER — Ambulatory Visit: Payer: 59

## 2023-01-15 DIAGNOSIS — M5459 Other low back pain: Secondary | ICD-10-CM | POA: Diagnosis not present

## 2023-01-15 DIAGNOSIS — M6281 Muscle weakness (generalized): Secondary | ICD-10-CM

## 2023-01-15 NOTE — Therapy (Signed)
Daily Note  Patient Name: Cynthia Barrett MRN: [id number] DOB:May 21, 1965, 57 y.o., female Today's Date: 01/15/2023   PT End of Session - 01/15/23 1659     Visit Number 12    Date for PT Re-Evaluation 01/26/23    Authorization Type UHC    PT Start Time 1616    PT Stop Time 1655    PT Time Calculation (min) 39 min    Activity Tolerance Patient tolerated treatment well    Behavior During Therapy [facility] for tasks assessed/performed                 Past Medical History:  Diagnosis Date   Anxiety    Depression    Past Surgical History:  Procedure Laterality Date   TOTAL LAPAROSCOPIC HYSTERECTOMY WITH SALPINGECTOMY  2022   wFU   TUBAL LIGATION  2004   tummy tuck  10/2004   Patient Active Problem List   Diagnosis Date Noted   ADHD, 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 No, Number of falls: 0  MOI/History of condition:  Onset date: >2 years  SUBJECTIVE STATEMENT  Cynthia Barrett continues to be consistent with her prescribed home program. She continues to note improvement in her pain, but continues to note areas of pain/mm tension along lateral hip, glute to lower back.     Red flags:  denies   Pain:  Are you having pain? Yes Pain location: R>L low back pain NPRS scale:  0/10 to 5/10 Aggravating factors: rotation or sitting for long periods Relieving factors: laying flat or standing Pain description: sharp and  aching Stage: Chronic 24 hour pattern: worse with activity   Occupation: works at [employer] (walking constantly)  Assistive Device: NA  Hand Dominance: NA  Patient Goals/Specific Activities: Reduce pain, get exercise problem to manage.   OBJECTIVE:   DIAGNOSTIC FINDINGS:   X-Ray Multilevel moderate degenerative disc disease and facet arthropathy, Findings have progressed when compared with the prior exam.  GENERAL OBSERVATION/GAIT:  Significant difficult with lumbar rotation limiting bed mobility  SENSATION:  Light touch: Appears intact  LUMBAR AROM  AROM AROM  (Eval)  Flexion Fingertips to mid shin  Extension limited by 25%  Right lateral flexion limited by 75%, w/ concordant pain (SI?  Left lateral flexion limited by 25%  Right rotation limited by 50%, w/ concordant pain  Left rotation limited by 25%    (Blank rows = not tested)   LE MMT:  MMT Right (Eval) Left (Eval)  Hip flexion (L2, L3) 4 4  Knee extension (L3) 4+ 4+  Knee flexion 4+ 4+  Hip abduction 4 4  Hip extension 4+ 3-*  Hip external rotation    Hip internal rotation    Hip adduction    Ankle dorsiflexion (L4)    Ankle plantarflexion (S1)    Ankle inversion    Ankle eversion    Great Toe ext (L5)    Grossly     (Blank rows =  not tested, score listed is out of 5 possible points.  N = WNL, D = diminished, C = clear for gross weakness with myotome testing, * = concordant pain with testing)   MUSCLE LENGTH: Hamstrings: Right no restriction; Left no restriction Cynthia Barrett Fus test: Right no restriction; Left no restriction   LE ROM:  ROM Right (Eval) Left (Eval)  Hip flexion N* N*  Hip extension    Hip abduction    Hip adduction    Hip internal rotation    Hip external rotation limited limited  Knee flexion    Knee extension    Ankle dorsiflexion    Ankle plantarflexion    Ankle inversion    Ankle eversion      (Blank rows = not tested, N = WNL, * = concordant pain with  testing)     PALPATION:   PA mobs lumbar spine cause R sided pain   Cluster of Laslett (2/4 tests considered +)  Distraction: NT Thigh thrust: negative Compression (S/L): negative Sacral thrust: negative   PATIENT SURVEYS:  FOTO TAKE VISIT 2 64%   PATIENT EDUCATION:  POC, diagnosis, prognosis, HEP, and outcome measures.  Pt educated via explanation, demonstration, and handout (HEP).  Pt confirms understanding verbally.    HOME EXERCISE PROGRAM: Access Code: [email or web address] URL: [email or web address] Date: 01/12/2023 Prepared by: Mauri Reading  Program Notes Added (1) hooklying pelvis expansion inhales, (2) hooklying rib cage expansion inhales + active expansion, (3) hooklying self mobilizations for the R lumbar spine using tennis ball  Exercises - Supine Lower Trunk Rotation with Swiss Ball  - 1 x daily - 7 x weekly - 3 sets - 10 reps - Bridge with Hamstring Curl on Swiss Ball  - 1 x daily - 7 x weekly - 2 sets - 10 reps - Isometric Dead Bug with Swiss Ball  - 1 x daily - 7 x weekly - 2 sets - 10 reps - Kettlebell Deadlift  - 1 x daily - 7 x weekly - 2 sets - 10 reps - Pelvic Tilt on Swiss Ball  - 1 x daily - 7 x weekly - 1 sets - 1 min  hold - Pelvic Circles on Swiss Ball  - 1 x daily - 7 x weekly - 1 sets - 1 min hold - Seated Lateral Pelvic Tilt on Swiss Ball  - 1 x daily - 7 x weekly - 1-2 sets - 30 min hold  Treatment priorities   Eval    R Rotation, R S/B    L hip ext strength    Lumbar flexion    Bil hip ER    CHECK PIRIFORMIS      TODAY'S TREATMENT    [facility] Adult PT Treatment:                                                DATE: 01/15/2023  Therapeutic Exercise:  nu-step L4 41m while taking subjective and planning session with patient Bridge on pball with HS curl, 2 x 10 Dead bugs, 2 x 10 each  S/L fire hydrants, x 20 each GTB progress to blue TB nxt visit  Seated pelvic tilts fwd/back, lateral x 1 minute each  Seated pelvic tilts CW/CCW on  dynadisc with mirror for feedback Supine hip abductor stretch with strap, 3 x 15 sec Supine fig 4 piriformis stretch with small LTR,  3 x 15 sec   Manual Therapy  LTR with overpressure from clinician for passive hip abductor, glute and QL stretch.  STM with running stick along Rt hip abductors, Rt glute med/max  [facility] Adult PT Treatment:                                                DATE: 01/12/2023  Therapeutic Exercise:  nu-step L4 7m while taking subjective and planning session with patient Bridge on pball with HS curl, 2 x 10 Dead bugs, 2 x 10 each  S/L fire hydrants  Seated pelvic tilts fwd/back, lateral, CW/CCW on dynadisc with mirror for feedback  Reviewed and updated HEP    [facility] Adult PT Treatment:                                                DATE: 01/10/2023  Therapeutic Exercise: L sidelying: Manual stretching of the R lumbar paraspinals Hooklying - instructed: Pelvic expansion inhales, 5 min Rib cage expansion inhales + active rib cage expansion, 5 min Self mobilization for R lumbar region using tennis ball  Manual Therapy: L sidelying: Soft itssue mobiliation R lumbar paraspinals Unilateral PA glides R L4/L5   [facility] Adult PT Treatment:                                                DATE: 01/03/2023  Therapeutic Exercise:   nu-step L4 46m while taking subjective and planning session with patient Bridge on pball with HS curl, 2 x 15 DL 2 F64 with 33# DB BIL Glute Bridges, with hip abduction, blue TB, 2 x 10  Dead bugs with pball, 1 x 5 pball, 2 x 5 without, 1 x 15 without  LTR with pball x 2 min DKTC with pball x 1 min  Pallof press, green TB, 2 x 10 BIL     ASSESSMENT:  CLINICAL IMPRESSION:  Danya responded well to manual therapy for STM as well as Rt glute, hip abductor and QL stretching. She reports some relief and confidence with independent stretches for home program. She also continues to have positive response to increased resistance and exercises  with core and LE strengthening program. We will continue to address pelvic mobility deficits and LQ strengthening as indicated.     OBJECTIVE IMPAIRMENTS: Pain, hip mobility, lumbar mobility, hip and core strength  ACTIVITY LIMITATIONS: bending, lifting, transfers, bed mobility, housework  PERSONAL FACTORS: See medical history and pertinent history   REHAB POTENTIAL: Good  CLINICAL DECISION MAKING: Stable/uncomplicated  EVALUATION COMPLEXITY: Low   GOALS:   SHORT TERM GOALS: Target date: 12/29/2022   Bonne will be >75% HEP compliant to improve carryover between sessions and facilitate independent management of condition  Evaluation: ongoing Goal status: MET   LONG TERM GOALS: Target date: 01/26/2023   Shayden will improve FOTO score to goal as a proxy for functional improvement  Evaluation/Baseline: 64% Goal status: INITIAL    2.  Salema will self report >/= 50% decrease in pain from evaluation   Evaluation/Baseline: 5/10 max pain Goal status: INITIAL   3.  Dazsha will report confidence in self management of condition at time of discharge with advanced HEP  Evaluation/Baseline: unable to self manage Goal status: INITIAL   4.  Miyani will self report significant improvement in mobility  Evaluation/Baseline: limited and painful Goal status: INITIAL    PLAN: PT FREQUENCY: 1-2x/week  PT DURATION: 8 weeks  PLANNED INTERVENTIONS: Therapeutic exercises, Aquatic therapy, Therapeutic activity, Neuro Muscular re-education, Gait training, Patient/Family education, Joint mobilization, Dry Needling, Electrical stimulation, Spinal mobilization and/or manipulation, Moist heat, Taping, Vasopneumatic device, Ionotophoresis 4mg /ml Dexamethasone, and Manual therapy   Mauri Reading PT, DPT 01/15/2023, 6:50 PM

## 2023-01-17 ENCOUNTER — Ambulatory Visit: Payer: 59

## 2023-01-17 DIAGNOSIS — M5459 Other low back pain: Secondary | ICD-10-CM | POA: Diagnosis not present

## 2023-01-17 DIAGNOSIS — M6281 Muscle weakness (generalized): Secondary | ICD-10-CM

## 2023-01-17 NOTE — Therapy (Signed)
Daily Note  Patient Name: Cynthia Barrett MRN: [id number] DOB:1965/08/07, 57 y.o., female Today's Date: 01/17/2023   PT End of Session - 01/17/23 1623     Visit Number 13    Date for PT Re-Evaluation 01/26/23    Authorization Type [employer]    PT Start Time 1619    PT Stop Time 1658    PT Time Calculation (min) 39 min    Activity Tolerance Patient tolerated treatment well    Behavior During Therapy [facility] for tasks assessed/performed                  Past Medical History:  Diagnosis Date   Anxiety    Depression    Past Surgical History:  Procedure Laterality Date   TOTAL LAPAROSCOPIC HYSTERECTOMY WITH SALPINGECTOMY  2022   wFU   TUBAL LIGATION  2004   tummy tuck  10/2004   Patient Active Problem List   Diagnosis Date Noted   ADHD, 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 No, Number of falls: 0  MOI/History of condition:  Onset date: >2 years  SUBJECTIVE STATEMENT  Patient has been working on new stretches at home and states that they have been helpful so far.     Red flags:  denies   Pain:  Are you having pain? Yes Pain location: R>L low back pain NPRS scale:  0/10 to 5/10 Aggravating factors: rotation or sitting for long periods Relieving factors: laying flat or standing Pain description: sharp and aching Stage: Chronic 24 hour pattern: worse with activity   Occupation: works at [employer] (walking  constantly)  Assistive Device: NA  Hand Dominance: NA  Patient Goals/Specific Activities: Reduce pain, get exercise problem to manage.   OBJECTIVE:   DIAGNOSTIC FINDINGS:   X-Ray Multilevel moderate degenerative disc disease and facet arthropathy, Findings have progressed when compared with the prior exam.  GENERAL OBSERVATION/GAIT:  Significant difficult with lumbar rotation limiting bed mobility  SENSATION:  Light touch: Appears intact  LUMBAR AROM  AROM AROM  (Eval)  Flexion Fingertips to mid shin  Extension limited by 25%  Right lateral flexion limited by 75%, w/ concordant pain (SI?  Left lateral flexion limited by 25%  Right rotation limited by 50%, w/ concordant pain  Left rotation limited by 25%    (Blank rows = not tested)   LE MMT:  MMT Right (Eval) Left (Eval)  Hip flexion (L2, L3) 4 4  Knee extension (L3) 4+ 4+  Knee flexion 4+ 4+  Hip abduction 4 4  Hip extension 4+ 3-*  Hip external rotation    Hip internal rotation    Hip adduction    Ankle dorsiflexion (L4)    Ankle plantarflexion (S1)    Ankle inversion    Ankle eversion    Great Toe ext (L5)    Grossly     (Blank rows = not tested, score listed is out of 5 possible points.  N = WNL,  D = diminished, C = clear for gross weakness with myotome testing, * = concordant pain with testing)   MUSCLE LENGTH: Hamstrings: Right no restriction; Left no restriction [name] test: Right no restriction; Left no restriction   LE ROM:  ROM Right (Eval) Left (Eval)  Hip flexion N* N*  Hip extension    Hip abduction    Hip adduction    Hip internal rotation    Hip external rotation limited limited  Knee flexion    Knee extension    Ankle dorsiflexion    Ankle plantarflexion    Ankle inversion    Ankle eversion      (Blank rows = not tested, N = WNL, * = concordant pain with testing)     PALPATION:   PA mobs lumbar spine cause R sided pain   Cluster of [place] (2/4 tests considered  +)  Distraction: NT Thigh thrust: negative Compression (S/L): negative Sacral thrust: negative   PATIENT SURVEYS:  FOTO TAKE VISIT 2 64%   PATIENT EDUCATION:  POC, diagnosis, prognosis, HEP, and outcome measures.  Pt educated via explanation, demonstration, and handout (HEP).  Pt confirms understanding verbally.    HOME EXERCISE PROGRAM: Access Code: [email or web address] URL: [email or web address] Date: 01/12/2023 Prepared by: Mauri Reading  Program Notes Added (1) hooklying pelvis expansion inhales, (2) hooklying rib cage expansion inhales + active expansion, (3) hooklying self mobilizations for the R lumbar spine using tennis ball  Exercises - Supine Lower Trunk Rotation with Swiss Ball  - 1 x daily - 7 x weekly - 3 sets - 10 reps - Bridge with Hamstring Curl on Swiss Ball  - 1 x daily - 7 x weekly - 2 sets - 10 reps - Isometric Dead Bug with Swiss Ball  - 1 x daily - 7 x weekly - 2 sets - 10 reps - Kettlebell Deadlift  - 1 x daily - 7 x weekly - 2 sets - 10 reps - Pelvic Tilt on Swiss Ball  - 1 x daily - 7 x weekly - 1 sets - 1 min  hold - Pelvic Circles on Swiss Ball  - 1 x daily - 7 x weekly - 1 sets - 1 min hold - Seated Lateral Pelvic Tilt on Swiss Ball  - 1 x daily - 7 x weekly - 1-2 sets - 30 min hold  Treatment priorities   Eval    R Rotation, R S/B    L hip ext strength    Lumbar flexion    Bil hip ER    CHECK PIRIFORMIS       TODAY'S TREATMENT   [facility] Adult PT Treatment:                                                DATE: 01/17/2023   Therapeutic Exercise:  nu-step L4 67m while taking subjective and planning session with patient Bridge on pball with HS curl, 2 x 10 Dead bugs, with blue TB 2 x 10 each  S/L fire hydrants, x 20 each blue TB  S/L reverse clams with 2# ankle weight Supine hip abductor stretch with strap, 3 x 20 sec Supine fig 4 piriformis stretch with small LTR, 3 x 20 sec     [facility] Adult PT Treatment:  DATE: 01/15/2023  Therapeutic Exercise:  nu-step L4 21m while taking subjective and planning session with patient Bridge on pball with HS curl, 2 x 10 Dead bugs, 2 x 10 each  S/L fire hydrants, x 20 each GTB progress to blue TB nxt visit  Seated pelvic tilts fwd/back, lateral x 1 minute each  Seated pelvic tilts CW/CCW on dynadisc with mirror for feedback Supine hip abductor stretch with strap, 3 x 15 sec Supine fig 4 piriformis stretch with small LTR, 3 x 15 sec   Manual Therapy  LTR with overpressure from clinician for passive hip abductor, glute and QL stretch.  STM with running stick along Rt hip abductors, Rt glute med/max  [facility] Adult PT Treatment:                                                DATE: 01/12/2023  Therapeutic Exercise:  nu-step L4 42m while taking subjective and planning session with patient Bridge on pball with HS curl, 2 x 10 Dead bugs, 2 x 10 each  S/L fire hydrants  Seated pelvic tilts fwd/back, lateral, CW/CCW on dynadisc with mirror for feedback  Reviewed and updated HEP    [facility] Adult PT Treatment:                                                DATE: 01/10/2023  Therapeutic Exercise: L sidelying: Manual stretching of the R lumbar paraspinals Hooklying - instructed: Pelvic expansion inhales, 5 min Rib cage expansion inhales + active rib cage expansion, 5 min Self mobilization for R lumbar region using tennis ball  Manual Therapy: L sidelying: Soft itssue mobiliation R lumbar paraspinals Unilateral PA glides R L4/L5   [facility] Adult PT Treatment:                                                DATE: 01/03/2023  Therapeutic Exercise:   nu-step L4 53m while taking subjective and planning session with patient Bridge on pball with HS curl, 2 x 15 DL 2 Z61 with 09# DB BIL Glute Bridges, with hip abduction, blue TB, 2 x 10  Dead bugs with pball, 1 x 5 pball, 2 x 5 without, 1 x 15 without  LTR with pball x 2 min DKTC with pball x 1 min   Pallof press, green TB, 2 x 10 BIL     ASSESSMENT:  CLINICAL IMPRESSION:  Patient continues to respond to PT well and has improvements in symptoms with LE stretching. She was also able to progress resistance with LE strengthening activities. Plan is to resume lumbar manual therapy at next visit and assess current plans for discharge.     OBJECTIVE IMPAIRMENTS: Pain, hip mobility, lumbar mobility, hip and core strength  ACTIVITY LIMITATIONS: bending, lifting, transfers, bed mobility, housework  PERSONAL FACTORS: See medical history and pertinent history   REHAB POTENTIAL: Good  CLINICAL DECISION MAKING: Stable/uncomplicated  EVALUATION COMPLEXITY: Low   GOALS:   SHORT TERM GOALS: Target date: 12/29/2022   Macayle will be >75% HEP compliant to improve carryover between sessions and facilitate independent management  of condition  Evaluation: ongoing Goal status: MET   LONG TERM GOALS: Target date: 01/26/2023   Lajuanna will improve FOTO score to goal as a proxy for functional improvement  Evaluation/Baseline: 64% Goal status: INITIAL    2.  Suriah will self report >/= 50% decrease in pain from evaluation   Evaluation/Baseline: 5/10 max pain Goal status: INITIAL   3.  Eleaner will report confidence in self management of condition at time of discharge with advanced HEP  Evaluation/Baseline: unable to self manage Goal status: INITIAL   4.  Kimberlin will self report significant improvement in mobility  Evaluation/Baseline: limited and painful Goal status: INITIAL    PLAN: PT FREQUENCY: 1-2x/week  PT DURATION: 8 weeks  PLANNED INTERVENTIONS: Therapeutic exercises, Aquatic therapy, Therapeutic activity, Neuro Muscular re-education, Gait training, Patient/Family education, Joint mobilization, Dry Needling, Electrical stimulation, Spinal mobilization and/or manipulation, Moist heat, Taping, Vasopneumatic device, Ionotophoresis 4mg /ml Dexamethasone, and Manual  therapy   Mauri Reading PT, DPT 01/17/2023, 5:01 PM

## 2023-01-18 ENCOUNTER — Encounter: Payer: Self-pay | Admitting: Physical Therapy

## 2023-01-22 ENCOUNTER — Ambulatory Visit: Payer: 59

## 2023-04-09 ENCOUNTER — Other Ambulatory Visit: Payer: Self-pay | Admitting: Family Medicine

## 2023-06-24 ENCOUNTER — Ambulatory Visit: Payer: 59 | Admitting: Orthopaedic Surgery

## 2023-06-24 ENCOUNTER — Encounter: Payer: Self-pay | Admitting: Orthopaedic Surgery

## 2023-06-24 DIAGNOSIS — M65311 Trigger thumb, right thumb: Secondary | ICD-10-CM | POA: Diagnosis not present

## 2023-06-24 MED ORDER — METHYLPREDNISOLONE ACETATE 40 MG/ML IJ SUSP
40.0000 mg | INTRAMUSCULAR | Status: AC | PRN
Start: 2023-06-24 — End: 2023-06-24
  Administered 2023-06-24: 40 mg

## 2023-06-24 MED ORDER — LIDOCAINE HCL 1 % IJ SOLN
1.0000 mL | INTRAMUSCULAR | Status: AC | PRN
Start: 2023-06-24 — End: 2023-06-24
  Administered 2023-06-24: 1 mL

## 2023-06-24 NOTE — Progress Notes (Signed)
 The patient is a right-hand-dominant female who comes in today with triggering of her right thumb.  She points to the A1 pulley as a source of her pain but is definitely having active triggering of the thumb.  She is not a diabetic.  This has been going on for about 6 months now.  She is not a diabetic.  She works as a Curator for many years.  She still works and jobs that she uses her hands quite a bit.  Examination of her right right thumb shows active triggering with pain at the A1 pulley.  The remainder of her hand exam is normal.  I did recommend a one-time steroid injection over the A1 pulley and she agreed to this and tolerated well.  I have also recommended Voltaren gel.  I want her to place a small amount over this area 2-3 times a day.  Will then see her back in a month.  If she still having triggering we would then consider an A1 pulley release as an outpatient.  All questions and concerns were answered and addressed.  She agrees with this treatment plan.    Procedure Note  Patient: Cynthia Barrett             Date of Birth: [birth year]           MRN: [id number]             Visit Date: 06/24/2023  Procedures: Visit Diagnoses:  1. Trigger thumb, right thumb     Hand/UE Inj: R thumb A1 for trigger finger on 06/24/2023 10:17 AM Medications: 1 mL lidocaine 1 %; 40 mg methylPREDNISolone acetate 40 MG/ML

## 2023-07-22 ENCOUNTER — Ambulatory Visit: Admitting: Orthopaedic Surgery

## 2023-07-22 ENCOUNTER — Encounter: Payer: Self-pay | Admitting: Orthopaedic Surgery

## 2023-07-22 DIAGNOSIS — M65311 Trigger thumb, right thumb: Secondary | ICD-10-CM

## 2023-07-22 NOTE — Progress Notes (Signed)
 The patient is returning 4 weeks after having a steroid injection of the right thumb A1 pulley.  She said the triggering is gone and the injection was very helpful.  On exam there is no active triggering of her right thumb.  There is no pain over the A1 pulley and overall she looks good.  If she starts having any type of pain over the A1 pulley have recommended a small amount of Voltaren gel that she would spread over this area 2-3 times daily.  We can always repeat a steroid injection if she does have a recurrence of triggering.  Worst-case scenario would be an A1 pulley release.  She understands this as well.  All questions and concerns were addressed and answered.  Follow-up is as needed.

## 2023-08-05 ENCOUNTER — Other Ambulatory Visit: Payer: Self-pay | Admitting: Family Medicine

## 2023-08-06 MED ORDER — MELOXICAM 15 MG PO TABS: ORAL_TABLET | ORAL | 0 refills | Status: DC

## 2023-11-06 ENCOUNTER — Other Ambulatory Visit: Payer: Self-pay | Admitting: Family Medicine

## 2023-12-04 ENCOUNTER — Encounter: Admitting: Family Medicine

## 2023-12-25 ENCOUNTER — Encounter: Payer: Self-pay | Admitting: Family Medicine

## 2023-12-25 ENCOUNTER — Ambulatory Visit (INDEPENDENT_AMBULATORY_CARE_PROVIDER_SITE_OTHER): Admitting: Family Medicine

## 2023-12-25 VITALS — BP 121/63 | HR 72 | Ht 61.0 in | Wt 149.0 lb

## 2023-12-25 DIAGNOSIS — L659 Nonscarring hair loss, unspecified: Secondary | ICD-10-CM | POA: Diagnosis not present

## 2023-12-25 DIAGNOSIS — Z1231 Encounter for screening mammogram for malignant neoplasm of breast: Secondary | ICD-10-CM

## 2023-12-25 DIAGNOSIS — Z23 Encounter for immunization: Secondary | ICD-10-CM

## 2023-12-25 DIAGNOSIS — Z0000 Encounter for general adult medical examination without abnormal findings: Secondary | ICD-10-CM | POA: Diagnosis not present

## 2023-12-25 DIAGNOSIS — N390 Urinary tract infection, site not specified: Secondary | ICD-10-CM | POA: Diagnosis not present

## 2023-12-25 MED ORDER — ESTRADIOL 0.1 MG/GM VA CREA: 1.0000 | TOPICAL_CREAM | Freq: Every day | VAGINAL | 10 refills | Status: AC

## 2023-12-25 NOTE — Addendum Note (Signed)
 Addended by: OLEY CHIQUITA CROME on: 12/25/2023 02:09 PM   Modules accepted: Orders

## 2023-12-25 NOTE — Progress Notes (Signed)
 Complete physical exam  Patient: Cynthia Barrett   DOB: 28-Nov-1965   58 y.o. Female  MRN: [id number]  Subjective:    Chief Complaint  Patient presents with   Annual Exam    Cynthia Barrett is a 58 y.o. female who presents today for a complete physical exam. She reports consuming a general diet. The patient has a physically strenuous job, but has no regular exercise apart from work.  She generally feels well. She reports sleeping fairly well, gets up to pee 2 x per night . She does not have additional problems to discuss today.   Has some hair loss on scalp x 9-12 months .  No spot of hair loss seems to be all over.  Stopped coloring hair to reduce breakage.  She is not on any prescription medications except for meloxicam  occasionally and a multivitamin which is not new.  She would like to have hormone levels checked just to make sure it is not contributing she is about 3 years postmenopausal now she did have a hysterectomy but still has her ovaries in place.  She has also been struggling with some recurrent UTIs this year that she has never really had issues with before she is wondering if that may also be related to menopause based on some reading that she has been doing.   Most recent fall risk assessment:    10/15/2022    7:36 AM  Fall Risk   Falls in the past year? 0  Number falls in past yr: 0  Injury with Fall? 0  Risk for fall due to : No Fall Risks  Follow up Falls evaluation completed     Most recent depression screenings:    12/25/2023    8:34 AM 10/15/2022    7:36 AM  PHQ 2/9 Scores  PHQ - 2 Score 0 0         Patient Care Team: Alvan Dorothyann BIRCH, MD as PCP - General   Outpatient Medications Prior to Visit  Medication Sig   meloxicam  (MOBIC ) 15 MG tablet TAKE 1 TABLET BY MOUTH EVERY DAY AS NEEDED FOR PAIN   Multiple Vitamin (MULTIVITAMIN) tablet Take 1 tablet by mouth daily.   [DISCONTINUED] Cyanocobalamin (VITAMIN B12 PO) Take by mouth.   No  facility-administered medications prior to visit.    ROS        Objective:     BP 121/63   Pulse 72   Ht 5' 1 (1.549 m)   Wt 149 lb (67.6 kg)   LMP 11/15/2014   SpO2 99%   BMI 28.15 kg/m     Physical Exam Vitals and nursing note reviewed.  Constitutional:      Appearance: Normal appearance.  HENT:     Head: Normocephalic and atraumatic.  Eyes:     Conjunctiva/sclera: Conjunctivae normal.  Cardiovascular:     Rate and Rhythm: Normal rate and regular rhythm.  Pulmonary:     Effort: Pulmonary effort is normal.     Breath sounds: Normal breath sounds.  Skin:    General: Skin is warm and dry.  Neurological:     Mental Status: She is alert.  Psychiatric:        Mood and Affect: Mood normal.      No results found for any visits on 12/25/23.      Assessment & Plan:    Routine Health Maintenance and Physical Exam  Immunization History  Administered Date(s) Administered   Hepatitis B 06/08/2017, 07/10/2017, 01/23/2018  Influenza Whole 02/14/2009, 12/22/2009   Influenza, Quadrivalent, Recombinant, Inj, Pf 01/22/2019   Influenza,inj,Quad PF,6+ Mos 01/13/2018   Influenza-Unspecified 02/22/2015, 01/10/2016   Moderna Sars-Covid-2 Vaccination 02/03/2020, 03/02/2020, 09/02/2020   Pneumococcal Polysaccharide-23 08/17/2013   Td 01/31/2007   Tdap 02/22/2016, 01/13/2018   Zoster Recombinant(Shingrix) 05/21/2019, 09/26/2020    Health Maintenance  Topic Date Due   Pneumococcal Vaccine: 50+ Years (2 of 2 - PCV) 08/18/2014   MAMMOGRAM  11/02/2023   INFLUENZA VACCINE  11/22/2023   COVID-19 Vaccine (4 - 2025-26 season) 12/23/2023   Fecal DNA (Cologuard)  02/22/2024   DTaP/Tdap/Td (4 - Td or Tdap) 01/14/2028   Hepatitis B Vaccines 19-59 Average Risk  Completed   Hepatitis C Screening  Completed   HIV Screening  Completed   Zoster Vaccines- Shingrix  Completed   HPV VACCINES  Aged Out   Meningococcal B Vaccine  Aged Out    Discussed health benefits of physical  activity, and encouraged her to engage in regular exercise appropriate for her age and condition.  Problem List Items Addressed This Visit   None Visit Diagnoses       Encounter for screening mammogram for malignant neoplasm of breast    -  Primary   Relevant Orders   MM 3D SCREENING MAMMOGRAM BILATERAL BREAST     Hair loss       Relevant Orders   CMP14+EGFR   CBC   TSH   Estradiol    Progesterone    Follicle stimulating hormone   Luteinizing hormone   Testosterone , Free, Total, SHBG   B12   Iron Binding Cap (TIBC)([facility])     Wellness examination       Relevant Orders   CMP14+EGFR   Lipid panel   CBC   TSH   Estradiol    Progesterone    Follicle stimulating hormone   Luteinizing hormone   Testosterone , Free, Total, SHBG   B12     Recurrent UTI       Relevant Medications   estradiol  (ESTRACE ) 0.1 MG/GM vaginal cream      Wellness exam-will get updated labs today.  Given Prevnar 20 and flu vaccine.  Encouraged regular exercise.  Will get updated mammogram scheduled.  Hair loss-will do some additional labs.  If everything comes back normal consider trial of topical minoxidil.  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  Discussed a trial of topical estrogen cream.  Return in about 4 months (around 04/25/2024) for Hair loss and UTIS.  SABRA Dorothyann Byars, MD

## 2023-12-26 ENCOUNTER — Ambulatory Visit: Payer: Self-pay | Admitting: Family Medicine

## 2023-12-26 LAB — IRON AND TIBC
Iron Saturation: 34 % (ref 15–55)
Iron: 127 ug/dL (ref 27–159)
Total Iron Binding Capacity: 373 ug/dL (ref 250–450)
UIBC: 246 ug/dL (ref 131–425)

## 2023-12-26 NOTE — Progress Notes (Signed)
 Hi Cynthia Barrett, DL cholesterol and total cholesterol are elevated just continue to work on healthy diet and regular exercise.  Your good cholesterol looks fantastic.  Metabolic panel overall looks okay.  Just a little bit dry on the day that you had labs drawn.  Blood count is normal.  Thyroid looks great.  Her bones are consistent with being postmenopausal but nothing out of the norm.  Your B12 looks good.  Testosterone  so far looks good the free level is still pending.  Iron levels look great.

## 2023-12-31 LAB — CBC
Hematocrit: 41.7 % (ref 34.0–46.6)
Hemoglobin: 13.9 g/dL (ref 11.1–15.9)
MCH: 31.7 pg (ref 26.6–33.0)
MCHC: 33.3 g/dL (ref 31.5–35.7)
MCV: 95 fL (ref 79–97)
Platelets: 199 x10E3/uL (ref 150–450)
RBC: 4.38 x10E6/uL (ref 3.77–5.28)
RDW: 12.3 % (ref 11.7–15.4)
WBC: 4.9 x10E3/uL (ref 3.4–10.8)

## 2023-12-31 LAB — CMP14+EGFR
ALT: 20 IU/L (ref 0–32)
AST: 23 IU/L (ref 0–40)
Albumin: 4.2 g/dL (ref 3.8–4.9)
Alkaline Phosphatase: 89 IU/L (ref 44–121)
BUN/Creatinine Ratio: 29 — ABNORMAL HIGH (ref 9–23)
BUN: 25 mg/dL — ABNORMAL HIGH (ref 6–24)
Bilirubin Total: 0.5 mg/dL (ref 0.0–1.2)
CO2: 26 mmol/L (ref 20–29)
Calcium: 9.5 mg/dL (ref 8.7–10.2)
Chloride: 101 mmol/L (ref 96–106)
Creatinine, Ser: 0.85 mg/dL (ref 0.57–1.00)
Globulin, Total: 2.5 g/dL (ref 1.5–4.5)
Glucose: 92 mg/dL (ref 70–99)
Potassium: 5.2 mmol/L (ref 3.5–5.2)
Sodium: 139 mmol/L (ref 134–144)
Total Protein: 6.7 g/dL (ref 6.0–8.5)
eGFR: 80 mL/min/1.73 (ref >59)

## 2023-12-31 LAB — LIPID PANEL
Chol/HDL Ratio: 3.1 ratio (ref 0.0–4.4)
Cholesterol, Total: 233 mg/dL — ABNORMAL HIGH (ref 100–199)
HDL: 76 mg/dL (ref >39)
LDL Chol Calc (NIH): 144 mg/dL — ABNORMAL HIGH (ref 0–99)
Triglycerides: 75 mg/dL (ref 0–149)
VLDL Cholesterol Cal: 13 mg/dL (ref 5–40)

## 2023-12-31 LAB — VITAMIN B12: Vitamin B-12: 917 pg/mL (ref 232–1245)

## 2023-12-31 LAB — LUTEINIZING HORMONE: LH: 44.2 m[IU]/mL

## 2023-12-31 LAB — TESTOSTERONE, FREE, TOTAL, SHBG
Sex Hormone Binding: 69.4 nmol/L (ref 17.3–125.0)
Testosterone, Free: 0.6 pg/mL (ref 0.0–4.2)
Testosterone: 14 ng/dL (ref 4–50)

## 2023-12-31 LAB — ESTRADIOL: Estradiol: 17.9 pg/mL

## 2023-12-31 LAB — TSH: TSH: 1.64 u[IU]/mL (ref 0.450–4.500)

## 2023-12-31 LAB — FOLLICLE STIMULATING HORMONE: FSH: 95.4 m[IU]/mL

## 2023-12-31 LAB — PROGESTERONE: Progesterone: 0.1 ng/mL

## 2024-01-01 NOTE — Progress Notes (Signed)
 HI Cynthia Barrett, for your hair loss I would recommend starting topical minoxidil.  It used to be prescription years ago but it is now over-the-counter.  It is also called Rogaine.  There are 2 different versions 1 is a women's version and 1 is a men's version.  That is the first step in treating generalized hair loss which can be somewhat genetic but can also somewhat be aging.  There is also a little bit of data around taking biotin as well.  The topical minoxidil works slowly as most medications do.  So would recommend using it for 6 months consistently to see if you are noticing improvement in hair growth.  Usually starts seeing some growth around the 8 to 12-week mark.  And then we want to look for sustained growth.  If you are doing well at 6 months and I would recommend that you continue with the therapy routinely.

## 2024-02-24 ENCOUNTER — Encounter: Payer: Self-pay | Admitting: Radiology

## 2024-03-03 ENCOUNTER — Encounter: Payer: Self-pay | Admitting: Family Medicine

## 2024-03-03 DIAGNOSIS — Z1211 Encounter for screening for malignant neoplasm of colon: Secondary | ICD-10-CM

## 2024-03-17 ENCOUNTER — Ambulatory Visit: Payer: Self-pay | Admitting: Family Medicine

## 2024-03-17 LAB — COLOGUARD: COLOGUARD: NEGATIVE

## 2024-03-17 NOTE — Progress Notes (Signed)
 Great news! Your Cologuard test is negative.  Recommend repeat colon cancer screening in 3 years.

## 2024-03-18 ENCOUNTER — Other Ambulatory Visit: Payer: Self-pay | Admitting: Family Medicine

## 2024-04-27 ENCOUNTER — Ambulatory Visit: Admitting: Family Medicine
